# Patient Record
Sex: Female | Born: 1990 | Hispanic: Yes | Marital: Single | Smoking: Never smoker
Health system: Southern US, Community
[De-identification: ages and names within clinical notes are randomized; demographics above are authoritative.]

## PROBLEM LIST (undated history)

## (undated) DIAGNOSIS — R519 Headache, unspecified: Secondary | ICD-10-CM

## (undated) DIAGNOSIS — R51 Headache: Secondary | ICD-10-CM

## (undated) DIAGNOSIS — F411 Generalized anxiety disorder: Secondary | ICD-10-CM

## (undated) DIAGNOSIS — R030 Elevated blood-pressure reading, without diagnosis of hypertension: Secondary | ICD-10-CM

## (undated) DIAGNOSIS — M1990 Unspecified osteoarthritis, unspecified site: Secondary | ICD-10-CM

## (undated) DIAGNOSIS — F32A Depression, unspecified: Secondary | ICD-10-CM

## (undated) DIAGNOSIS — F329 Major depressive disorder, single episode, unspecified: Secondary | ICD-10-CM

## (undated) HISTORY — DX: Major depressive disorder, single episode, unspecified: F32.9

## (undated) HISTORY — DX: Depression, unspecified: F32.A

## (undated) HISTORY — DX: Headache, unspecified: R51.9

## (undated) HISTORY — DX: Elevated blood-pressure reading, without diagnosis of hypertension: R03.0

## (undated) HISTORY — DX: Unspecified osteoarthritis, unspecified site: M19.90

## (undated) HISTORY — DX: Generalized anxiety disorder: F41.1

## (undated) HISTORY — DX: Headache: R51

---

## 2016-07-26 HISTORY — PX: MULTIPLE TOOTH EXTRACTIONS: SHX2053

## 2016-12-22 ENCOUNTER — Ambulatory Visit (INDEPENDENT_AMBULATORY_CARE_PROVIDER_SITE_OTHER): Payer: 59 | Admitting: Primary Care

## 2016-12-22 ENCOUNTER — Encounter: Payer: Self-pay | Admitting: Primary Care

## 2016-12-22 ENCOUNTER — Encounter (INDEPENDENT_AMBULATORY_CARE_PROVIDER_SITE_OTHER): Payer: Self-pay

## 2016-12-22 VITALS — BP 126/80 | HR 114 | Temp 98.5°F | Ht 61.25 in | Wt 158.0 lb

## 2016-12-22 DIAGNOSIS — F419 Anxiety disorder, unspecified: Secondary | ICD-10-CM | POA: Diagnosis not present

## 2016-12-22 DIAGNOSIS — F329 Major depressive disorder, single episode, unspecified: Secondary | ICD-10-CM

## 2016-12-22 DIAGNOSIS — F32A Depression, unspecified: Secondary | ICD-10-CM

## 2016-12-22 MED ORDER — SERTRALINE HCL 25 MG PO TABS: 25.0000 mg | ORAL_TABLET | Freq: Every day | ORAL | 1 refills | Status: DC

## 2016-12-22 NOTE — Progress Notes (Signed)
   Subjective:    Patient ID: Gail Copeland, female    DOB: 1991-01-08, 26 y.o.   MRN: [id number]  HPI  Gail Copeland is a 25 year old female who presents today to establish care and discuss the problems mentioned below. Will obtain old records.  1) Elevated Blood Pressure Reading: Reading of 144/82 in the office today, 126/80 on recheck. She has a history of elevated readings in the doctor's office. She's checked her BP several times at [facility] with readings of 120/80's. She denies chest pain, shortness of breath, dizziness. She is under a lot of stress with work and school.   2) Depression: She will experience tearfulness, feeling sad/down, fatigue, anxiety in large public spaces, difficulty sleeping. Symptoms have been present intermittently for most of her adult life. PHQ 9 score of 17 and GAD 7 score of 18 today. She is working two jobs and is in school full time. Denies SI/HI. She is interested in treatment.   Review of Systems  Constitutional: Positive for fatigue.  Respiratory: Negative for shortness of breath.   Cardiovascular: Negative for chest pain.  Neurological: Negative for dizziness.  Psychiatric/Behavioral:       See HPI       Past Medical History:  Diagnosis Date  . Arthritis   . Depression   . Elevated BP without diagnosis of hypertension   . Frequent headaches   . GAD (generalized anxiety disorder)      Social History   Social History  . Marital status: Single    Spouse name: N/A  . Number of children: N/A  . Years of education: N/A   Occupational History  . Not on file.   Social History Main Topics  . Smoking status: Never Smoker  . Smokeless tobacco: Never Used  . Alcohol use No  . Drug use: Unknown  . Sexual activity: Not on file   Other Topics Concern  . Not on file   Social History Narrative   Single.   No children.   Works as a Nutritional therapist.   Currently a Consulting civil engineer at [employer].   Enjoys spending time outdoors.     Past  Surgical History:  Procedure Laterality Date  . MULTIPLE TOOTH EXTRACTIONS  07/2016    Family History  Problem Relation Age of Onset  . Arthritis Mother   . Diabetes Paternal Grandfather   . Heart attack Paternal Grandfather   . Hyperlipidemia Paternal Grandfather   . Hypertension Paternal Grandfather   . Kidney disease Paternal Grandfather     No Known Allergies  No current outpatient prescriptions on file prior to visit.   No current facility-administered medications on file prior to visit.     BP 126/80   Pulse (!) 114   Temp 98.5 F (36.9 C) (Oral)   Ht 5' 1.25" (1.556 m)   Wt 158 lb (71.7 kg)   LMP 12/13/2016   SpO2 99%   BMI 29.61 kg/m    Objective:   Physical Exam  Constitutional: She appears well-nourished.  Neck: Neck supple.  Cardiovascular: Normal rate and regular rhythm.   Pulmonary/Chest: Effort normal and breath sounds normal.  Skin: Skin is warm and dry.  Psychiatric: She has a normal mood and affect.          Assessment & Plan:

## 2016-12-22 NOTE — Patient Instructions (Addendum)
Start taking sertraline (Zoloft) 25 mg tablets for anxiety and depression. Start by taking 1/2 tablet daily for 8 days then increase to 1 full tablet thereafter.  Please call or message me if you have any problems.   Please schedule a follow up visit with me in 6 weeks for re-evaluation.   It was a pleasure to meet you today! Please don't hesitate to call me with any questions. Welcome to [facility]!

## 2016-12-22 NOTE — Assessment & Plan Note (Signed)
GAD 7 score of 18 and PHQ 9 score of 17 today.  No prior treatment. Discussed options for treatment including CBT and medication, she opts for medication.  Rx for Zoloft 25 mg sent to pharmacy. Patient is to take 1/2 tablet daily for 8 days, then advance to 1 full tablet thereafter. We discussed possible side effects of headache, GI upset, drowsiness, and SI/HI. If thoughts of SI/HI develop, we discussed to present to the emergency immediately. Patient verbalized understanding.   Follow up in 6 weeks for re-evaluation.

## 2017-02-02 ENCOUNTER — Ambulatory Visit: Payer: 59 | Admitting: Primary Care

## 2017-02-03 ENCOUNTER — Encounter: Payer: Self-pay | Admitting: Primary Care

## 2017-02-03 ENCOUNTER — Ambulatory Visit (INDEPENDENT_AMBULATORY_CARE_PROVIDER_SITE_OTHER): Payer: 59 | Admitting: Primary Care

## 2017-02-03 VITALS — BP 108/62 | HR 74 | Temp 98.2°F | Wt 153.5 lb

## 2017-02-03 DIAGNOSIS — F419 Anxiety disorder, unspecified: Secondary | ICD-10-CM

## 2017-02-03 DIAGNOSIS — F329 Major depressive disorder, single episode, unspecified: Secondary | ICD-10-CM | POA: Diagnosis not present

## 2017-02-03 DIAGNOSIS — F32A Depression, unspecified: Secondary | ICD-10-CM

## 2017-02-03 MED ORDER — SERTRALINE HCL 50 MG PO TABS: 50.0000 mg | ORAL_TABLET | Freq: Every day | ORAL | 1 refills | Status: DC

## 2017-02-03 NOTE — Progress Notes (Signed)
   Subjective:    Patient ID: Gail Copeland, female    DOB: 13-Sep-1990, 26 y.o.   MRN: [id number]  HPI  Gail Copeland is a 26 year old female who presents today for follow up of anxiety and depression.   She presented as a new patient in late August 2018 with complaints of tearfulness, feeling sad/down, fatigue, anxiety in public spaces, insomnia. PHQ 9 score of 17 and GAD 7 score of 18 during that visit so she was initiated on Zoloft 25 mg as she declined therapy.   Since her last visit she's noticed some improvement. She's noticed that she's not crying as much. Since her last visit her mother was diagnosed with breast cancer so this has caused a lot of new stress. She continues to have insomnia as she will have no problem falling asleep but will wake several times during the middle of the night most every day of the week. She's tried Melatonin in the past without improvement.   She denies GI upset, nausea, SI/HI. Overall she feels as though we are on the right path with Zoloft.   Review of Systems  Respiratory: Negative for shortness of breath.   Cardiovascular: Negative for chest pain.  Gastrointestinal: Negative for abdominal pain and nausea.  Psychiatric/Behavioral: Positive for sleep disturbance. Negative for suicidal ideas. The patient is nervous/anxious.        See HPI       Past Medical History:  Diagnosis Date  . Arthritis   . Depression   . Elevated BP without diagnosis of hypertension   . Frequent headaches   . GAD (generalized anxiety disorder)      Social History   Social History  . Marital status: Single    Spouse name: N/A  . Number of children: N/A  . Years of education: N/A   Occupational History  . Not on file.   Social History Main Topics  . Smoking status: Never Smoker  . Smokeless tobacco: Never Used  . Alcohol use No  . Drug use: Unknown  . Sexual activity: Not on file   Other Topics Concern  . Not on file   Social History Narrative   Single.   No children.   Works as a Nutritional therapist.   Currently a Consulting civil engineer at [employer].   Enjoys spending time outdoors.     Past Surgical History:  Procedure Laterality Date  . MULTIPLE TOOTH EXTRACTIONS  07/2016    Family History  Problem Relation Age of Onset  . Arthritis Mother   . Diabetes Paternal Grandfather   . Heart attack Paternal Grandfather   . Hyperlipidemia Paternal Grandfather   . Hypertension Paternal Grandfather   . Kidney disease Paternal Grandfather     No Known Allergies  No current outpatient prescriptions on file prior to visit.   No current facility-administered medications on file prior to visit.     BP 108/62   Pulse 74   Temp 98.2 F (36.8 C) (Oral)   Wt 153 lb 8 oz (69.6 kg)   LMP 01/14/2017   SpO2 99%   BMI 28.77 kg/m    Objective:   Physical Exam  Constitutional: She appears well-nourished.  Neck: Neck supple.  Cardiovascular: Normal rate and regular rhythm.   Pulmonary/Chest: Effort normal and breath sounds normal.  Skin: Skin is warm and dry.  Psychiatric: She has a normal mood and affect.          Assessment & Plan:

## 2017-02-03 NOTE — Assessment & Plan Note (Signed)
Slight improvement on Zoloft 25 mg, will likely benefit from an increased dose. Discussed this with patient and she agrees. Rx for Zoloft 50 mg sent to pharmacy, follow up in 6 weeks.

## 2017-02-03 NOTE — Patient Instructions (Signed)
We've increased the dose of your Zoloft form 25 mg to 50 mg.  You may take two of the 25 mg tablets to equal 50 mg until your current bottle is empty.  Schedule a follow up visit in 6 weeks for re-evaluation.  It was a pleasure to see you today!

## 2017-02-20 ENCOUNTER — Other Ambulatory Visit: Payer: Self-pay | Admitting: Primary Care

## 2017-02-20 DIAGNOSIS — F32A Depression, unspecified: Secondary | ICD-10-CM

## 2017-02-20 DIAGNOSIS — F329 Major depressive disorder, single episode, unspecified: Secondary | ICD-10-CM

## 2017-02-20 DIAGNOSIS — F419 Anxiety disorder, unspecified: Principal | ICD-10-CM

## 2017-03-17 ENCOUNTER — Ambulatory Visit: Payer: 59 | Admitting: Primary Care

## 2017-03-26 ENCOUNTER — Ambulatory Visit (INDEPENDENT_AMBULATORY_CARE_PROVIDER_SITE_OTHER): Payer: 59 | Admitting: Primary Care

## 2017-03-26 ENCOUNTER — Encounter: Payer: Self-pay | Admitting: Primary Care

## 2017-03-26 DIAGNOSIS — F419 Anxiety disorder, unspecified: Secondary | ICD-10-CM | POA: Diagnosis not present

## 2017-03-26 DIAGNOSIS — F329 Major depressive disorder, single episode, unspecified: Secondary | ICD-10-CM

## 2017-03-26 DIAGNOSIS — F32A Depression, unspecified: Secondary | ICD-10-CM

## 2017-03-26 NOTE — Patient Instructions (Signed)
Please notify me if your symptoms progress and/or if you'd like to consider therapy.  It was a pleasure to see you today!

## 2017-03-26 NOTE — Progress Notes (Signed)
Subjective:    Patient ID: Gail RoutNancy Copeland, female    DOB: [birth year], 26 y.o.   MRN: [id number]  HPI  Gail Copeland is a 26 year old female who presents today for follow up of anxiety and depression.   She was last evaluated on 02/03/17 for anxiety and depression. She had been on Zoloft 25 mg and endorsed noticing a light improvement, but not significant. Her dose was increased to 50 mg that visit.  Since her last visit she's stopped taking her medication. She's not had this in over one week. She missed a few days of her medication and didn't feel any different. She has co-workers that are taking Effexor and will notice when they don't take their medication, she doesn't want to be like this. She did experience GI upset with the increased dose of Zoloft.   She would like to remain off of medication for anxiety and depression. She's still not sleeping well, failed Melatonin and Unisom OTC. She's currently taking Zzzquil which is effective. She does not wish to discuss other treatment for insomnia.    Review of Systems  Psychiatric/Behavioral: Positive for sleep disturbance.       See HPI       Past Medical History:  Diagnosis Date  . Arthritis   . Depression   . Elevated BP without diagnosis of hypertension   . Frequent headaches   . GAD (generalized anxiety disorder)      Social History   Socioeconomic History  . Marital status: Single    Spouse name: Not on file  . Number of children: Not on file  . Years of education: Not on file  . Highest education level: Not on file  Social Needs  . Financial resource strain: Not on file  . Food insecurity - worry: Not on file  . Food insecurity - inability: Not on file  . Transportation needs - medical: Not on file  . Transportation needs - non-medical: Not on file  Occupational History  . Not on file  Tobacco Use  . Smoking status: Never Smoker  . Smokeless tobacco: Never Used  Substance and Sexual Activity  . Alcohol use: No  .  Drug use: Not on file  . Sexual activity: Not on file  Other Topics Concern  . Not on file  Social History Narrative   Single.   No children.   Works as a Nutritional therapistlab and pharmacy tech.   Currently a Consulting civil engineerstudent at [employer].   Enjoys spending time outdoors.       Family History  Problem Relation Age of Onset  . Arthritis Mother   . Diabetes Paternal Grandfather   . Heart attack Paternal Grandfather   . Hyperlipidemia Paternal Grandfather   . Hypertension Paternal Grandfather   . Kidney disease Paternal Grandfather     No Known Allergies  No current outpatient medications on file prior to visit.   No current facility-administered medications on file prior to visit.     BP 120/82   Pulse 81   Temp 98.2 F (36.8 C) (Oral)   Ht 5' 1.25" (1.556 m)   Wt 160 lb 12.8 oz (72.9 kg)   SpO2 99%   BMI 30.14 kg/m    Objective:   Physical Exam  Constitutional: She appears well-nourished.  Neck: Neck supple.  Cardiovascular: Normal rate and regular rhythm.  Pulmonary/Chest: Effort normal and breath sounds normal.  Skin: Skin is warm and dry.  Psychiatric: She has a normal mood and affect.  Assessment & Plan:

## 2017-03-26 NOTE — Assessment & Plan Note (Signed)
No Zoloft in one week, well continue to hold for now. Discussed other options for treatment including medications and therapy, she kindly declines. She will update if symptoms progress.

## 2017-04-17 ENCOUNTER — Other Ambulatory Visit: Payer: Self-pay | Admitting: Primary Care

## 2017-04-17 DIAGNOSIS — F329 Major depressive disorder, single episode, unspecified: Secondary | ICD-10-CM

## 2017-04-17 DIAGNOSIS — F419 Anxiety disorder, unspecified: Principal | ICD-10-CM

## 2017-04-17 DIAGNOSIS — F32A Depression, unspecified: Secondary | ICD-10-CM

## 2017-06-21 ENCOUNTER — Encounter: Payer: Self-pay | Admitting: Primary Care

## 2017-06-21 ENCOUNTER — Ambulatory Visit: Payer: 59 | Admitting: Primary Care

## 2017-06-21 VITALS — BP 118/84 | HR 90 | Temp 98.5°F | Ht 61.25 in | Wt 169.5 lb

## 2017-06-21 DIAGNOSIS — R51 Headache: Secondary | ICD-10-CM

## 2017-06-21 DIAGNOSIS — R519 Headache, unspecified: Secondary | ICD-10-CM

## 2017-06-21 LAB — BASIC METABOLIC PANEL
BUN: 12 mg/dL (ref 6–23)
CALCIUM: 9.8 mg/dL (ref 8.4–10.5)
CO2: 29 mEq/L (ref 19–32)
Chloride: 102 mEq/L (ref 96–112)
Creatinine, Ser: 0.5 mg/dL (ref 0.40–1.20)
GFR: 159.22 mL/min (ref >60.00)
Glucose, Bld: 90 mg/dL (ref 70–99)
Potassium: 3.8 mEq/L (ref 3.5–5.1)
SODIUM: 138 meq/L (ref 135–145)

## 2017-06-21 LAB — CBC
HEMATOCRIT: 38.3 % (ref 36.0–46.0)
Hemoglobin: 12.7 g/dL (ref 12.0–15.0)
MCHC: 33.1 g/dL (ref 30.0–36.0)
MCV: 83 fl (ref 78.0–100.0)
Platelets: 324 10*3/uL (ref 150.0–400.0)
RBC: 4.61 Mil/uL (ref 3.87–5.11)
RDW: 14.9 % (ref 11.5–15.5)
WBC: 7.6 10*3/uL (ref 4.0–10.5)

## 2017-06-21 MED ORDER — KETOROLAC TROMETHAMINE 60 MG/2ML IM SOLN
60.0000 mg | Freq: Once | INTRAMUSCULAR | Status: AC
  Administered 2017-06-21: 60 mg via INTRAMUSCULAR

## 2017-06-21 NOTE — Addendum Note (Signed)
Addended by: Tawnya CrookSAMBATH, Mychael Smock on: 06/21/2017 01:04 PM   Modules accepted: Orders

## 2017-06-21 NOTE — Patient Instructions (Signed)
Stop by the lab prior to leaving today. I will notify you of your results once received.   You were provided with an injection for Toradol for the headache.   You can also try taking naproxen (Aleve) or Excedrin Migraine as needed for headache symptoms.   You can also try Meclizine tablets for dizziness. Caution as this may cause drowsiness.   Please notify me if your symptoms persist despite treatment.   It was a pleasure to see you today!

## 2017-06-21 NOTE — Progress Notes (Signed)
Subjective:    Patient ID: Gail Copeland, female    DOB: 1990-09-07, 27 y.o.   MRN: [id number]  HPI  Gail Copeland is a 27 year old female who presents today with a chief complaint of headache. She also reports dizziness, nausea, neck pain, rhinorrhea. Her symptoms began two weeks ago.   Her headache is located to the bilateral occipital lobes which she first noticed 2 weeks ago. She'll also experience pain to the left parietal region when she feels dizzy. She'll experience dizziness with most any movement. Yesterday she noticed blurred vision to her right eye when looking at her phone, this lasted for about one hour and has not returned. Her last eye exam was in November 2018 and was normal.   She denies photophobia, phonophobia, vomiting, abdominal pain, diarrhea, fevers, cough, weakness. She's ben taking Tylenol and Nyquil at bedtime every night for the past one week with improvement. Her rhinorrhea has dissipated.   Review of Systems  Constitutional: Negative for chills and fever.  HENT: Negative for congestion.   Respiratory: Negative for cough and shortness of breath.   Neurological: Positive for dizziness and headaches. Negative for weakness.       Past Medical History:  Diagnosis Date  . Arthritis   . Depression   . Elevated BP without diagnosis of hypertension   . Frequent headaches   . GAD (generalized anxiety disorder)      Social History   Socioeconomic History  . Marital status: Single    Spouse name: Not on file  . Number of children: Not on file  . Years of education: Not on file  . Highest education level: Not on file  Social Needs  . Financial resource strain: Not on file  . Food insecurity - worry: Not on file  . Food insecurity - inability: Not on file  . Transportation needs - medical: Not on file  . Transportation needs - non-medical: Not on file  Occupational History  . Not on file  Tobacco Use  . Smoking status: Never Smoker  . Smokeless tobacco:  Never Used  Substance and Sexual Activity  . Alcohol use: No  . Drug use: Not on file  . Sexual activity: Not on file  Other Topics Concern  . Not on file  Social History Narrative   Single.   No children.   Works as a Nutritional therapist.   Currently a Consulting civil engineer at [employer].   Enjoys spending time outdoors.      Family History  Problem Relation Age of Onset  . Arthritis Mother   . Diabetes Paternal Grandfather   . Heart attack Paternal Grandfather   . Hyperlipidemia Paternal Grandfather   . Hypertension Paternal Grandfather   . Kidney disease Paternal Grandfather     No Known Allergies  No current outpatient medications on file prior to visit.   No current facility-administered medications on file prior to visit.     BP 118/84   Pulse 90   Temp 98.5 F (36.9 C) (Oral)   Ht 5' 1.25" (1.556 m)   Wt 169 lb 8 oz (76.9 kg)   LMP 06/08/2017   SpO2 98%   BMI 31.77 kg/m    Objective:   Physical Exam  Constitutional: She is oriented to person, place, and time. She appears well-nourished.  HENT:  Right Ear: Tympanic membrane and ear canal normal.  Left Ear: Tympanic membrane and ear canal normal.  Nose: Right sinus exhibits no maxillary sinus tenderness  and no frontal sinus tenderness. Left sinus exhibits no maxillary sinus tenderness and no frontal sinus tenderness.  Mouth/Throat: Oropharynx is clear and moist.  Eyes: Conjunctivae and EOM are normal.  Neck: Neck supple.  Cardiovascular: Normal rate and regular rhythm.  Pulmonary/Chest: Effort normal and breath sounds normal. She has no wheezes. She has no rales.  Lymphadenopathy:    She has no cervical adenopathy.  Neurological: She is alert and oriented to person, place, and time. No cranial nerve deficit.  Negative Brudzinski and Kernig's signs.  Skin: Skin is warm and dry.          Assessment & Plan:  Occipital Headache:  Present for the past 2 weeks, also with dizziness, neck pain. Exam  today unremarkable, low suspicion for meningitis (no fevers, negative testing in the office today). Check CBC. Symptoms likely from headache/migraine. Treat with IM Toradol today. Use Aleve/Excedrin Migraine PRN. Also discussed use of Meclizine with drowsiness precautions provided.  She'll update if her headache persists after treatment.    Doreene NestKatherine K Clark, NP

## 2017-06-22 ENCOUNTER — Other Ambulatory Visit: Payer: Self-pay | Admitting: Primary Care

## 2017-06-22 ENCOUNTER — Ambulatory Visit: Payer: Self-pay

## 2017-06-22 DIAGNOSIS — R519 Headache, unspecified: Secondary | ICD-10-CM

## 2017-06-22 DIAGNOSIS — R51 Headache: Principal | ICD-10-CM

## 2017-06-22 MED ORDER — SUMATRIPTAN SUCCINATE 25 MG PO TABS: ORAL_TABLET | ORAL | 0 refills | Status: DC

## 2017-06-22 NOTE — Telephone Encounter (Signed)
Spoke with patient who reports temporary improvement with Toradol so she returned to work. Within 1 hour after returning to work she experienced phonophobia and return in headaches. She vomited 2-3 times and felt better. She was sent home from work today due to headaches. Will send in treatment for migraine, Imitrex. Discussed instructions on use. Also discussed to go to the hospital for further evaluation if no improvement after treatment as she may need CT scan and IV treatment. She verbalized understanding.

## 2017-06-22 NOTE — Telephone Encounter (Signed)
Patient called in with c/o "headache with vomiting." She says "I was in the office yesterday, received a shot of Toradol and the headache was better. It came back as the night went on and I started vomiting. The pain is 6, then after I vomit it's a 3-4. I tried Aleve, but vomited 30 minutes after taking it, so I don't think it helped." I advised this would be sent over to the provider and someone will be in touch with the recommendations, she verbalized understanding.

## 2017-06-23 ENCOUNTER — Ambulatory Visit: Payer: 59 | Admitting: Primary Care

## 2017-06-29 ENCOUNTER — Encounter: Payer: Self-pay | Admitting: Primary Care

## 2017-06-29 DIAGNOSIS — G4452 New daily persistent headache (NDPH): Secondary | ICD-10-CM

## 2017-07-13 ENCOUNTER — Ambulatory Visit: Admission: RE | Admit: 2017-07-13 | Payer: 59 | Source: Ambulatory Visit

## 2017-07-22 ENCOUNTER — Ambulatory Visit
Admission: RE | Admit: 2017-07-22 | Discharge: 2017-07-22 | Disposition: A | Discharge status: Home / Self Care | Payer: 59 | Source: Ambulatory Visit | Attending: Primary Care | Admitting: Primary Care

## 2017-07-22 DIAGNOSIS — G4452 New daily persistent headache (NDPH): Secondary | ICD-10-CM | POA: Diagnosis not present

## 2017-07-22 MED ORDER — IOPAMIDOL (ISOVUE-370) INJECTION 76%
75.0000 mL | Freq: Once | INTRAVENOUS | Status: AC | PRN
  Administered 2017-07-22: 10:00:00 via INTRAVENOUS

## 2017-09-21 ENCOUNTER — Ambulatory Visit: Payer: 59 | Admitting: Primary Care

## 2017-09-21 VITALS — BP 116/70 | HR 60 | Temp 98.5°F | Ht 61.25 in | Wt 173.0 lb

## 2017-09-21 DIAGNOSIS — N926 Irregular menstruation, unspecified: Secondary | ICD-10-CM | POA: Diagnosis not present

## 2017-09-21 LAB — BASIC METABOLIC PANEL
BUN: 10 mg/dL (ref 6–23)
CALCIUM: 9.6 mg/dL (ref 8.4–10.5)
CO2: 27 mEq/L (ref 19–32)
Chloride: 105 mEq/L (ref 96–112)
Creatinine, Ser: 0.57 mg/dL (ref 0.40–1.20)
GFR: 136.61 mL/min (ref >60.00)
GLUCOSE: 87 mg/dL (ref 70–99)
POTASSIUM: 3.9 meq/L (ref 3.5–5.1)
SODIUM: 140 meq/L (ref 135–145)

## 2017-09-21 LAB — HEMOGLOBIN A1C: HEMOGLOBIN A1C: 5.4 % (ref 4.6–6.5)

## 2017-09-21 LAB — TSH: TSH: 1.59 u[IU]/mL (ref 0.35–4.50)

## 2017-09-21 NOTE — Patient Instructions (Signed)
Stop by the lab prior to leaving today. I will notify you of your results once received.   Please come see me if you have no period between now and August.   It was a pleasure to see you today!

## 2017-09-21 NOTE — Assessment & Plan Note (Signed)
Amenorrhea during months of March and April 2019. LMP 09/15/17, normal cycle. Check TSH, BMP, A1C today to rule out metabolic cause of irregularity.  Will monitor cycles for now and have her follow up in late August if no menstrual cycle. Consider birth control vs GYN evaluation if amenorrhea persists.

## 2017-09-21 NOTE — Progress Notes (Signed)
Subjective:    Patient ID: Gail Copeland, female    DOB: 04-Aug-1990, 27 y.o.   MRN: [id number]  HPI  Gail Copeland is a 27 year old female who presents today with a chief complaint of irregular menstrual cycle. Menarche at age 34.   She's had monthly menstrual cycles since menarche up until February 2019. She did not have a menstrual cycle in March and April 2019. Her menstrual cycle began on May 22nd and ended May 27th. This was a normal cycle.   She denies changes in diet, extreme exercise, increase in stress levels. She denies menorrhagia and dysmenorrhea. Her headaches have significantly improved since she's finished with school for the semester.   Review of Systems  Constitutional: Negative for fatigue.  Cardiovascular: Negative for palpitations.  Genitourinary: Positive for menstrual problem. Negative for dysuria, frequency, pelvic pain and vaginal discharge.  Psychiatric/Behavioral: The patient is not nervous/anxious.        Past Medical History:  Diagnosis Date  . Arthritis   . Depression   . Elevated BP without diagnosis of hypertension   . Frequent headaches   . GAD (generalized anxiety disorder)      Social History   Socioeconomic History  . Marital status: Single    Spouse name: Not on file  . Number of children: Not on file  . Years of education: Not on file  . Highest education level: Not on file  Occupational History  . Not on file  Social Needs  . Financial resource strain: Not on file  . Food insecurity:    Worry: Not on file    Inability: Not on file  . Transportation needs:    Medical: Not on file    Non-medical: Not on file  Tobacco Use  . Smoking status: Never Smoker  . Smokeless tobacco: Never Used  Substance and Sexual Activity  . Alcohol use: No  . Drug use: Not on file  . Sexual activity: Not on file  Lifestyle  . Physical activity:    Days per week: Not on file    Minutes per session: Not on file  . Stress: Not on file  Relationships    . Social connections:    Talks on phone: Not on file    Gets together: Not on file    Attends religious service: Not on file    Active member of club or organization: Not on file    Attends meetings of clubs or organizations: Not on file    Relationship status: Not on file  . Intimate partner violence:    Fear of current or ex partner: Not on file    Emotionally abused: Not on file    Physically abused: Not on file    Forced sexual activity: Not on file  Other Topics Concern  . Not on file  Social History Narrative   Single.   No children.   Works as a Nutritional therapist.   Currently a Consulting civil engineer at [employer].   Enjoys spending time outdoors.      Family History  Problem Relation Age of Onset  . Arthritis Mother   . Diabetes Paternal Grandfather   . Heart attack Paternal Grandfather   . Hyperlipidemia Paternal Grandfather   . Hypertension Paternal Grandfather   . Kidney disease Paternal Grandfather     No Known Allergies  Current Outpatient Medications on File Prior to Visit  Medication Sig Dispense Refill  . SUMAtriptan (IMITREX) 25 MG tablet Take 1 tablet  by mouth at migraine onset. May repeat in 2 hours if headache persists or recurs. Do not exceed 2 tablets in 24 hours. (Patient not taking: Reported on 09/21/2017) 10 tablet 0   No current facility-administered medications on file prior to visit.     BP 116/70   Pulse 60   Temp 98.5 F (36.9 C) (Oral)   Ht 5' 1.25" (1.556 m)   Wt 173 lb (78.5 kg)   SpO2 98%   BMI 32.42 kg/m    Objective:   Physical Exam  Constitutional: She appears well-nourished.  Neck: Neck supple.  Cardiovascular: Normal rate and regular rhythm.  Respiratory: Effort normal and breath sounds normal.  Skin: Skin is warm and dry.           Assessment & Plan:

## 2017-10-13 ENCOUNTER — Other Ambulatory Visit: Payer: Self-pay | Admitting: Primary Care

## 2017-10-13 DIAGNOSIS — Z0000 Encounter for general adult medical examination without abnormal findings: Secondary | ICD-10-CM

## 2017-11-04 ENCOUNTER — Other Ambulatory Visit (INDEPENDENT_AMBULATORY_CARE_PROVIDER_SITE_OTHER): Payer: 59

## 2017-11-04 DIAGNOSIS — Z0000 Encounter for general adult medical examination without abnormal findings: Secondary | ICD-10-CM | POA: Diagnosis not present

## 2017-11-04 LAB — LIPID PANEL
CHOL/HDL RATIO: 3
CHOLESTEROL: 166 mg/dL (ref 0–200)
HDL: 60 mg/dL (ref >39.00)
LDL Cholesterol: 94 mg/dL (ref 0–99)
NonHDL: 105.72
TRIGLYCERIDES: 58 mg/dL (ref 0.0–149.0)
VLDL: 11.6 mg/dL (ref 0.0–40.0)

## 2017-11-04 LAB — HEPATIC FUNCTION PANEL
ALT: 14 U/L (ref 0–35)
AST: 19 U/L (ref 0–37)
Albumin: 4.4 g/dL (ref 3.5–5.2)
Alkaline Phosphatase: 68 U/L (ref 39–117)
Bilirubin, Direct: 0 mg/dL (ref 0.0–0.3)
TOTAL PROTEIN: 7.9 g/dL (ref 6.0–8.3)
Total Bilirubin: 0.5 mg/dL (ref 0.2–1.2)

## 2017-11-09 ENCOUNTER — Encounter: Payer: Self-pay | Admitting: Primary Care

## 2017-11-09 ENCOUNTER — Ambulatory Visit (INDEPENDENT_AMBULATORY_CARE_PROVIDER_SITE_OTHER): Payer: 59 | Admitting: Primary Care

## 2017-11-09 VITALS — BP 116/70 | HR 65 | Temp 98.0°F | Ht 61.25 in | Wt 171.2 lb

## 2017-11-09 DIAGNOSIS — R519 Headache, unspecified: Secondary | ICD-10-CM | POA: Insufficient documentation

## 2017-11-09 DIAGNOSIS — R51 Headache: Secondary | ICD-10-CM

## 2017-11-09 DIAGNOSIS — F329 Major depressive disorder, single episode, unspecified: Secondary | ICD-10-CM | POA: Diagnosis not present

## 2017-11-09 DIAGNOSIS — F419 Anxiety disorder, unspecified: Secondary | ICD-10-CM | POA: Diagnosis not present

## 2017-11-09 DIAGNOSIS — N926 Irregular menstruation, unspecified: Secondary | ICD-10-CM

## 2017-11-09 DIAGNOSIS — Z0000 Encounter for general adult medical examination without abnormal findings: Secondary | ICD-10-CM | POA: Diagnosis not present

## 2017-11-09 DIAGNOSIS — Z23 Encounter for immunization: Secondary | ICD-10-CM

## 2017-11-09 DIAGNOSIS — F32A Depression, unspecified: Secondary | ICD-10-CM

## 2017-11-09 NOTE — Assessment & Plan Note (Signed)
Overall improved since reduced stress at work. Continue to monitor.

## 2017-11-09 NOTE — Addendum Note (Signed)
Addended by: Tawnya CrookSAMBATH, Miqueas Whilden on: 11/09/2017 05:03 PM   Modules accepted: Orders

## 2017-11-09 NOTE — Patient Instructions (Signed)
Start exercising. You should be getting 150 minutes of moderate intensity exercise weekly.  It's important to improve your diet by reducing consumption of fast food, fried food, processed snack foods, sugary drinks. Increase consumption of fresh vegetables and fruits, whole grains, water.  Ensure you are drinking 64 ounces of water daily.  You are due for a pap smear, please notify me when you are ready.  Please monitor your menstrual cycles as discussed. Work on [employer] and a healthy diet this may help regulate cycles.  It was a pleasure to see you today!   Preventive Care 18-39 Years, Female Preventive care refers to lifestyle choices and visits with your health care provider that can promote health and wellness. What does preventive care include?  A yearly physical exam. This is also called an annual well check.  Dental exams once or twice a year.  Routine eye exams. Ask your health care provider how often you should have your eyes checked.  Personal lifestyle choices, including: ? Daily care of your teeth and gums. ? Regular physical activity. ? Eating a healthy diet. ? Avoiding tobacco and drug use. ? Limiting alcohol use. ? Practicing safe sex. ? Taking vitamin and mineral supplements as recommended by your health care provider. What happens during an annual well check? The services and screenings done by your health care provider during your annual well check will depend on your age, overall health, lifestyle risk factors, and family history of disease. Counseling Your health care provider may ask you questions about your:  Alcohol use.  Tobacco use.  Drug use.  Emotional well-being.  Home and relationship well-being.  Sexual activity.  Eating habits.  Work and work Statistician.  Method of birth control.  Menstrual cycle.  Pregnancy history.  Screening You may have the following tests or measurements:  Height, weight, and BMI.  Diabetes screening.  This is done by checking your blood sugar (glucose) after you have not eaten for a while (fasting).  Blood pressure.  Lipid and cholesterol levels. These may be checked every 5 years starting at age 41.  Skin check.  Hepatitis C blood test.  Hepatitis B blood test.  Sexually transmitted disease (STD) testing.  BRCA-related cancer screening. This may be done if you have a family history of breast, ovarian, tubal, or peritoneal cancers.  Pelvic exam and Pap test. This may be done every 3 years starting at age 66. Starting at age 60, this may be done every 5 years if you have a Pap test in combination with an HPV test.  Discuss your test results, treatment options, and if necessary, the need for more tests with your health care provider. Vaccines Your health care provider may recommend certain vaccines, such as:  Influenza vaccine. This is recommended every year.  Tetanus, diphtheria, and acellular pertussis (Tdap, Td) vaccine. You may need a Td booster every 10 years.  Varicella vaccine. You may need this if you have not been vaccinated.  HPV vaccine. If you are 36 or younger, you may need three doses over 6 months.  Measles, mumps, and rubella (MMR) vaccine. You may need at least one dose of MMR. You may also need a second dose.  Pneumococcal 13-valent conjugate (PCV13) vaccine. You may need this if you have certain conditions and were not previously vaccinated.  Pneumococcal polysaccharide (PPSV23) vaccine. You may need one or two doses if you smoke cigarettes or if you have certain conditions.  Meningococcal vaccine. One dose is recommended if you are  age 44-21 years and a first-year college student living in a residence hall, or if you have one of several medical conditions. You may also need additional booster doses.  Hepatitis A vaccine. You may need this if you have certain conditions or if you travel or work in places where you may be exposed to hepatitis A.  Hepatitis B  vaccine. You may need this if you have certain conditions or if you travel or work in places where you may be exposed to hepatitis B.  Haemophilus influenzae type b (Hib) vaccine. You may need this if you have certain risk factors.  Talk to your health care provider about which screenings and vaccines you need and how often you need them. This information is not intended to replace advice given to you by your health care provider. Make sure you discuss any questions you have with your health care provider. Document Released: 06/09/2001 Document Revised: 01/01/2016 Document Reviewed: 02/12/2015 Elsevier Interactive Patient Education  [employer].

## 2017-11-09 NOTE — Assessment & Plan Note (Signed)
Continues to experience social anxiety, especially any more than 7 people in one room. She avoids heavily crowded places, goes to the grocery store. She does want to cry in these situations.   Referral placed to [facility] for further treatment. She agrees with plan.

## 2017-11-09 NOTE — Assessment & Plan Note (Signed)
Td due, provided today. Pap smear due, she kindly declines and will defer to later this year. Discussed the importance of a healthy diet and regular exercise in order for weight loss, and to reduce the risk of any potential medical problems. Exam unremarkable. Labs reviewed. Follow up in 1 year for CPE.

## 2017-11-09 NOTE — Assessment & Plan Note (Signed)
LMP was May 22nd.  Also due for pap smear for which she declines. Offered GYN referral for further evaluation of symptoms and for pap smear, she kindly declines.  Discussed the need for a pap smear, she verbalized understanding and will have this done later this year.   She would like to monitor and wait for a menstrual cycle. Discussed to work on a healthy diet and regular exercise to facilitate menstrual cycles.   She will update if no menstrual cycle in 2 months.

## 2017-11-09 NOTE — Progress Notes (Signed)
Subjective:    Patient ID: Gail Copeland, female    DOB: Dec 20, 1990, 27 y.o.   MRN: [id number]  HPI  Gail Copeland is a 27 year old female who presents today for complete physical.  Overall doing well.  Does continue to experience social anxiety, especially in crowds larger than 7 people. When she is around a large amounts of people she will find a corner to escape and feel like crying. She avoids crowded public spaces during the day such as the grocery store.  Menstrual cycles are still irregular. Last menstrual cycle was May 22nd.   Headaches overall improved.   Immunizations: -Tetanus: Unsure, thinks it was 2008 -Influenza: Completed last season   Diet: She endorses a poor diet Breakfast: Skips Lunch: Fast food Dinner: Rice, chicken Snacks: None Desserts: 3 times weekly  Beverages: Little water, lots of sweet tea  Exercise: She is not exercising  Eye exam: Completes annually Dental exam: Completes annually Pap Smear: Never completed, declines   Review of Systems  Constitutional: Negative for unexpected weight change.  HENT: Negative for rhinorrhea.   Respiratory: Negative for cough and shortness of breath.   Cardiovascular: Negative for chest pain.  Gastrointestinal: Negative for constipation and diarrhea.  Genitourinary: Positive for menstrual problem. Negative for difficulty urinating.  Musculoskeletal: Negative for arthralgias and myalgias.  Skin: Negative for rash.  Allergic/Immunologic: Negative for environmental allergies.  Neurological: Negative for dizziness and numbness.       Intermittent headaches  Psychiatric/Behavioral:       See HPI       Past Medical History:  Diagnosis Date  . Arthritis   . Depression   . Elevated BP without diagnosis of hypertension   . Frequent headaches   . GAD (generalized anxiety disorder)      Social History   Socioeconomic History  . Marital status: Single    Spouse name: Not on file  . Number of children: Not on  file  . Years of education: Not on file  . Highest education level: Not on file  Occupational History  . Not on file  Social Needs  . Financial resource strain: Not on file  . Food insecurity:    Worry: Not on file    Inability: Not on file  . Transportation needs:    Medical: Not on file    Non-medical: Not on file  Tobacco Use  . Smoking status: Never Smoker  . Smokeless tobacco: Never Used  Substance and Sexual Activity  . Alcohol use: No  . Drug use: Not on file  . Sexual activity: Not on file  Lifestyle  . Physical activity:    Days per week: Not on file    Minutes per session: Not on file  . Stress: Not on file  Relationships  . Social connections:    Talks on phone: Not on file    Gets together: Not on file    Attends religious service: Not on file    Active member of club or organization: Not on file    Attends meetings of clubs or organizations: Not on file    Relationship status: Not on file  . Intimate partner violence:    Fear of current or ex partner: Not on file    Emotionally abused: Not on file    Physically abused: Not on file    Forced sexual activity: Not on file  Other Topics Concern  . Not on file  Social History Narrative   Single.  No children.   Works as a Nutritional therapistlab and pharmacy tech.   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   Enjoys spending time outdoors.       Family History  Problem Relation Age of Onset  . Arthritis Mother   . Diabetes Paternal Grandfather   . Heart attack Paternal Grandfather   . Hyperlipidemia Paternal Grandfather   . Hypertension Paternal Grandfather   . Kidney disease Paternal Grandfather     No Known Allergies  Current Outpatient Medications on File Prior to Visit  Medication Sig Dispense Refill  . SUMAtriptan (IMITREX) 25 MG tablet Take 1 tablet by mouth at migraine onset. May repeat in 2 hours if headache persists or recurs. Do not exceed 2 tablets in 24 hours. (Patient not taking: Reported on  09/21/2017) 10 tablet 0   No current facility-administered medications on file prior to visit.     BP 116/70   Pulse 65   Temp 98 F (36.7 C) (Oral)   Ht 5' 1.25" (1.556 m)   Wt 171 lb 4 oz (77.7 kg)   LMP 09/09/2017   SpO2 99%   BMI 32.09 kg/m    Objective:   Physical Exam  Constitutional: She is oriented to person, place, and time. She appears well-nourished.  HENT:  Mouth/Throat: No oropharyngeal exudate.  Eyes: Pupils are equal, round, and reactive to light. EOM are normal.  Neck: Neck supple. No thyromegaly present.  Cardiovascular: Normal rate and regular rhythm.  Respiratory: Effort normal and breath sounds normal.  GI: Soft. Bowel sounds are normal. There is no tenderness.  Musculoskeletal: Normal range of motion.  Neurological: She is alert and oriented to person, place, and time.  Skin: Skin is warm and dry.  Psychiatric: She has a normal mood and affect.           Assessment & Plan:

## 2017-11-25 ENCOUNTER — Ambulatory Visit: Payer: 59 | Admitting: Psychology

## 2017-11-25 DIAGNOSIS — F411 Generalized anxiety disorder: Secondary | ICD-10-CM

## 2018-02-28 ENCOUNTER — Ambulatory Visit (INDEPENDENT_AMBULATORY_CARE_PROVIDER_SITE_OTHER): Payer: 59 | Admitting: Internal Medicine

## 2018-02-28 ENCOUNTER — Encounter: Payer: Self-pay | Admitting: Internal Medicine

## 2018-02-28 VITALS — BP 114/80 | HR 77 | Temp 98.1°F | Wt 175.0 lb

## 2018-02-28 DIAGNOSIS — J302 Other seasonal allergic rhinitis: Secondary | ICD-10-CM

## 2018-02-28 DIAGNOSIS — R05 Cough: Secondary | ICD-10-CM

## 2018-02-28 DIAGNOSIS — R059 Cough, unspecified: Secondary | ICD-10-CM

## 2018-02-28 MED ORDER — BENZONATATE 200 MG PO CAPS: 200.0000 mg | ORAL_CAPSULE | Freq: Three times a day (TID) | ORAL | 0 refills | Status: DC | PRN

## 2018-02-28 MED ORDER — HYDROCODONE-HOMATROPINE 5-1.5 MG/5ML PO SYRP: 5.0000 mL | ORAL_SOLUTION | Freq: Three times a day (TID) | ORAL | 0 refills | Status: DC | PRN

## 2018-02-28 NOTE — Progress Notes (Signed)
HPI  Pt presents to the clinic today with c/o runny nose, ear fullness, sore throat and cough. She reports this started 2-3 weeks ago. She is blowing clear mucous out of her nose. She denies ear pain, drainage or decreased hearing. She has had some difficulty swallowing. The cough is productive of white mucous. She denies nasal congestion or shortness of breath. She denies fever, chills or body aches. She has tried Tylenol, Delsym, Nyquil and Flonase with minimal relief. She has no history of allergies. She has not had sick contacts.  Review of Systems      Past Medical History:  Diagnosis Date  . Arthritis   . Depression   . Elevated BP without diagnosis of hypertension   . Frequent headaches   . GAD (generalized anxiety disorder)     Family History  Problem Relation Age of Onset  . Arthritis Mother   . Diabetes Paternal Grandfather   . Heart attack Paternal Grandfather   . Hyperlipidemia Paternal Grandfather   . Hypertension Paternal Grandfather   . Kidney disease Paternal Grandfather     Social History   Socioeconomic History  . Marital status: Single    Spouse name: Not on file  . Number of children: Not on file  . Years of education: Not on file  . Highest education level: Not on file  Occupational History  . Not on file  Social Needs  . Financial resource strain: Not on file  . Food insecurity:    Worry: Not on file    Inability: Not on file  . Transportation needs:    Medical: Not on file    Non-medical: Not on file  Tobacco Use  . Smoking status: Never Smoker  . Smokeless tobacco: Never Used  Substance and Sexual Activity  . Alcohol use: No  . Drug use: Not on file  . Sexual activity: Not on file  Lifestyle  . Physical activity:    Days per week: Not on file    Minutes per session: Not on file  . Stress: Not on file  Relationships  . Social connections:    Talks on phone: Not on file    Gets together: Not on file    Attends religious service: Not  on file    Active member of club or organization: Not on file    Attends meetings of clubs or organizations: Not on file    Relationship status: Not on file  . Intimate partner violence:    Fear of current or ex partner: Not on file    Emotionally abused: Not on file    Physically abused: Not on file    Forced sexual activity: Not on file  Other Topics Concern  . Not on file  Social History Narrative   Single.   No children.   Works as a Nutritional therapist.   Currently a Consulting civil engineer at [employer].   Enjoys spending time outdoors.     No Known Allergies   Constitutional: Denies headache, fatigue, fever or abrupt weight changes.  HEENT:  Positive runny nose, ear fullness, sore throat. Denies eye redness, eye pain, pressure behind the eyes, facial pain, nasal congestion, ear pain, ringing in the ears, wax buildup, or bloody nose. Respiratory: Positive cough. Denies difficulty breathing or shortness of breath.  Cardiovascular: Denies chest pain, chest tightness, palpitations or swelling in the hands or feet.   No other specific complaints in a complete review of systems (except as listed in HPI above).  Objective:   BP 114/80   Pulse 77   Temp 98.1 F (36.7 C) (Oral)   Wt 175 lb (79.4 kg)   LMP 02/18/2018 Comment: irregular  SpO2 99%   BMI 32.80 kg/m  Wt Readings from Last 3 Encounters:  02/28/18 175 lb (79.4 kg)  11/09/17 171 lb 4 oz (77.7 kg)  09/21/17 173 lb (78.5 kg)     General: Appears her stated age, obese, in NAD. HEENT: Head: normal shape and size, no sinus tenderness noted; ars: Tm's gray and intact, normal light reflex; Nose: mucosa pink and moist, septum midline; Throat/Mouth: + PND. Teeth present, mucosa erythematous and moist, no exudate noted, no lesions or ulcerations noted.  Neck: No cervical lymphadenopathy.  Cardiovascular: Normal rate and rhythm. S1,S2 noted.  No murmur, rubs or gallops noted.  Pulmonary/Chest: Normal effort and positive  vesicular breath sounds. No respiratory distress. No wheezes, rales or ronchi noted.       Assessment & Plan:   Allergic Rhinitis, Cough:  Get some rest and drink plenty of water Do salt water gargles for the sore throat Start Zyrtec OTC x 10 days eRx for Tessalon 200 mg TID prn eRx for Hycodan cough syrup  RTC as needed or if symptoms persist.   Nicki Reaper, NP

## 2018-02-28 NOTE — Patient Instructions (Signed)

## 2018-03-18 ENCOUNTER — Other Ambulatory Visit: Payer: Self-pay | Admitting: Primary Care

## 2018-03-18 DIAGNOSIS — R51 Headache: Principal | ICD-10-CM

## 2018-03-18 DIAGNOSIS — R519 Headache, unspecified: Secondary | ICD-10-CM

## 2018-06-22 ENCOUNTER — Other Ambulatory Visit: Payer: Self-pay | Admitting: Primary Care

## 2018-06-22 DIAGNOSIS — G43709 Chronic migraine without aura, not intractable, without status migrainosus: Secondary | ICD-10-CM

## 2018-06-22 DIAGNOSIS — R519 Headache, unspecified: Secondary | ICD-10-CM

## 2018-06-22 DIAGNOSIS — R51 Headache: Secondary | ICD-10-CM

## 2018-06-22 MED ORDER — SUMATRIPTAN SUCCINATE 25 MG PO TABS: ORAL_TABLET | ORAL | 0 refills | Status: DC

## 2018-06-22 NOTE — Telephone Encounter (Signed)
Noted.  Refill sent pharmacy. 

## 2018-06-22 NOTE — Addendum Note (Signed)
Addended by: Doreene Nest on: 06/22/2018 06:15 PM   Modules accepted: Orders

## 2018-06-22 NOTE — Telephone Encounter (Signed)
This Rx was refused since patient was no longer taking it.   Patient called office and ask if Gail Copeland can refill this again for her migraines.

## 2019-02-06 ENCOUNTER — Other Ambulatory Visit: Payer: Self-pay | Admitting: Primary Care

## 2019-02-06 DIAGNOSIS — Z0000 Encounter for general adult medical examination without abnormal findings: Secondary | ICD-10-CM

## 2019-02-14 ENCOUNTER — Other Ambulatory Visit (INDEPENDENT_AMBULATORY_CARE_PROVIDER_SITE_OTHER): Payer: Managed Care, Other (non HMO)

## 2019-02-14 DIAGNOSIS — Z0000 Encounter for general adult medical examination without abnormal findings: Secondary | ICD-10-CM

## 2019-02-14 LAB — COMPREHENSIVE METABOLIC PANEL
ALT: 20 U/L (ref 0–35)
AST: 22 U/L (ref 0–37)
Albumin: 4.3 g/dL (ref 3.5–5.2)
Alkaline Phosphatase: 89 U/L (ref 39–117)
BUN: 10 mg/dL (ref 6–23)
CO2: 25 mEq/L (ref 19–32)
Calcium: 9.4 mg/dL (ref 8.4–10.5)
Chloride: 102 mEq/L (ref 96–112)
Creatinine, Ser: 0.55 mg/dL (ref 0.40–1.20)
GFR: 132.5 mL/min (ref >60.00)
Glucose, Bld: 84 mg/dL (ref 70–99)
Potassium: 3.9 mEq/L (ref 3.5–5.1)
Sodium: 136 mEq/L (ref 135–145)
Total Bilirubin: 0.5 mg/dL (ref 0.2–1.2)
Total Protein: 7.4 g/dL (ref 6.0–8.3)

## 2019-02-14 LAB — LIPID PANEL
Cholesterol: 182 mg/dL (ref 0–200)
HDL: 58.1 mg/dL (ref >39.00)
LDL Cholesterol: 105 mg/dL — ABNORMAL HIGH (ref 0–99)
NonHDL: 123.76
Total CHOL/HDL Ratio: 3
Triglycerides: 95 mg/dL (ref 0.0–149.0)
VLDL: 19 mg/dL (ref 0.0–40.0)

## 2019-02-14 LAB — CBC
HCT: 38.1 % (ref 36.0–46.0)
Hemoglobin: 12.6 g/dL (ref 12.0–15.0)
MCHC: 33.1 g/dL (ref 30.0–36.0)
MCV: 80.9 fl (ref 78.0–100.0)
Platelets: 330 10*3/uL (ref 150.0–400.0)
RBC: 4.72 Mil/uL (ref 3.87–5.11)
RDW: 14.5 % (ref 11.5–15.5)
WBC: 10.2 10*3/uL (ref 4.0–10.5)

## 2019-02-21 ENCOUNTER — Encounter: Payer: Self-pay | Admitting: Primary Care

## 2019-02-21 ENCOUNTER — Ambulatory Visit (INDEPENDENT_AMBULATORY_CARE_PROVIDER_SITE_OTHER): Payer: Managed Care, Other (non HMO) | Admitting: Primary Care

## 2019-02-21 ENCOUNTER — Other Ambulatory Visit: Payer: Self-pay

## 2019-02-21 ENCOUNTER — Other Ambulatory Visit (HOSPITAL_COMMUNITY)
Admission: RE | Admit: 2019-02-21 | Discharge: 2019-02-21 | Disposition: A | Discharge status: Home / Self Care | Payer: Managed Care, Other (non HMO) | Source: Ambulatory Visit | Attending: Primary Care | Admitting: Primary Care

## 2019-02-21 VITALS — BP 124/74 | HR 74 | Temp 98.0°F | Ht 61.25 in | Wt 185.8 lb

## 2019-02-21 DIAGNOSIS — R5382 Chronic fatigue, unspecified: Secondary | ICD-10-CM | POA: Insufficient documentation

## 2019-02-21 DIAGNOSIS — Z124 Encounter for screening for malignant neoplasm of cervix: Secondary | ICD-10-CM | POA: Diagnosis not present

## 2019-02-21 DIAGNOSIS — R519 Headache, unspecified: Secondary | ICD-10-CM

## 2019-02-21 DIAGNOSIS — N926 Irregular menstruation, unspecified: Secondary | ICD-10-CM

## 2019-02-21 DIAGNOSIS — L732 Hidradenitis suppurativa: Secondary | ICD-10-CM

## 2019-02-21 DIAGNOSIS — Z0000 Encounter for general adult medical examination without abnormal findings: Secondary | ICD-10-CM | POA: Diagnosis not present

## 2019-02-21 DIAGNOSIS — R5383 Other fatigue: Secondary | ICD-10-CM | POA: Diagnosis not present

## 2019-02-21 MED ORDER — CLINDAMYCIN PHOSPHATE 1 % EX SOLN: Freq: Two times a day (BID) | CUTANEOUS | 0 refills | Status: DC

## 2019-02-21 NOTE — Patient Instructions (Signed)
Stop by the lab prior to leaving today. I will notify you of your results once received.   You can apply the clindamycin solution twice daily to the armpits and groin for active symptoms.   You will be contacted regarding your referral to dermatology.  Please let us know if you have not been contacted within two weeks.   Start exercising. You should be getting 150 minutes of moderate intensity exercise weekly.  It's important to improve your diet by reducing consumption of fast food, fried food, processed snack foods, sugary drinks. Increase consumption of fresh vegetables and fruits, whole grains, water.  Ensure you are drinking 64 ounces of water daily.  It was a pleasure to see you today!   Preventive Care 58-8 Years Old, Female Preventive care refers to visits with your health care provider and lifestyle choices that can promote health and wellness. This includes:  A yearly physical exam. This may also be called an annual well check.  Regular dental visits and eye exams.  Immunizations.  Screening for certain conditions.  Healthy lifestyle choices, such as eating a healthy diet, getting regular exercise, not using drugs or products that contain nicotine and tobacco, and limiting alcohol use. What can I expect for my preventive care visit? Physical exam Your health care provider will check your:  Height and weight. This may be used to calculate body mass index (BMI), which tells if you are at a healthy weight.  Heart rate and blood pressure.  Skin for abnormal spots. Counseling Your health care provider may ask you questions about your:  Alcohol, tobacco, and drug use.  Emotional well-being.  Home and relationship well-being.  Sexual activity.  Eating habits.  Work and work Statistician.  Method of birth control.  Menstrual cycle.  Pregnancy history. What immunizations do I need?  Influenza (flu) vaccine  This is recommended every year. Tetanus,  diphtheria, and pertussis (Tdap) vaccine  You may need a Td booster every 10 years. Varicella (chickenpox) vaccine  You may need this if you have not been vaccinated. Human papillomavirus (HPV) vaccine  If recommended by your health care provider, you may need three doses over 6 months. Measles, mumps, and rubella (MMR) vaccine  You may need at least one dose of MMR. You may also need a second dose. Meningococcal conjugate (MenACWY) vaccine  One dose is recommended if you are age 10-21 years and a first-year college student living in a residence hall, or if you have one of several medical conditions. You may also need additional booster doses. Pneumococcal conjugate (PCV13) vaccine  You may need this if you have certain conditions and were not previously vaccinated. Pneumococcal polysaccharide (PPSV23) vaccine  You may need one or two doses if you smoke cigarettes or if you have certain conditions. Hepatitis A vaccine  You may need this if you have certain conditions or if you travel or work in places where you may be exposed to hepatitis A. Hepatitis B vaccine  You may need this if you have certain conditions or if you travel or work in places where you may be exposed to hepatitis B. Haemophilus influenzae type b (Hib) vaccine  You may need this if you have certain conditions. You may receive vaccines as individual doses or as more than one vaccine together in one shot (combination vaccines). Talk with your health care provider about the risks and benefits of combination vaccines. What tests do I need?  Blood tests  Lipid and cholesterol levels. These may  be checked every 5 years starting at age 57.  Hepatitis C test.  Hepatitis B test. Screening  Diabetes screening. This is done by checking your blood sugar (glucose) after you have not eaten for a while (fasting).  Sexually transmitted disease (STD) testing.  BRCA-related cancer screening. This may be done if you have  a family history of breast, ovarian, tubal, or peritoneal cancers.  Pelvic exam and Pap test. This may be done every 3 years starting at age 35. Starting at age 54, this may be done every 5 years if you have a Pap test in combination with an HPV test. Talk with your health care provider about your test results, treatment options, and if necessary, the need for more tests. Follow these instructions at home: Eating and drinking   Eat a diet that includes fresh fruits and vegetables, whole grains, lean protein, and low-fat dairy.  Take vitamin and mineral supplements as recommended by your health care provider.  Do not drink alcohol if: ? Your health care provider tells you not to drink. ? You are pregnant, may be pregnant, or are planning to become pregnant.  If you drink alcohol: ? Limit how much you have to 0-1 drink a day. ? Be aware of how much alcohol is in your drink. In the [place] one drink equals one 12 oz bottle of beer (355 mL), one 5 oz glass of wine (148 mL), or one 1 oz glass of hard liquor (44 mL). Lifestyle  Take daily care of your teeth and gums.  Stay active. Exercise for at least 30 minutes on 5 or more days each week.  Do not use any products that contain nicotine or tobacco, such as cigarettes, e-cigarettes, and chewing tobacco. If you need help quitting, ask your health care provider.  If you are sexually active, practice safe sex. Use a condom or other form of birth control (contraception) in order to prevent pregnancy and STIs (sexually transmitted infections). If you plan to become pregnant, see your health care provider for a preconception visit. What's next?  Visit your health care provider once a year for a well check visit.  Ask your health care provider how often you should have your eyes and teeth checked.  Stay up to date on all vaccines. This information is not intended to replace advice given to you by your health care provider. Make sure you discuss  any questions you have with your health care provider. Document Released: 06/09/2001 Document Revised: 12/23/2017 Document Reviewed: 12/23/2017 Elsevier Patient Education  2020 [employer].

## 2019-02-21 NOTE — Assessment & Plan Note (Signed)
Suspect secondary to lack of physical activity and weight gain. Anxiety could also be contributing.  Check labs today to rule out metabolic cause. Encouraged regular exercise, healthy diet.

## 2019-02-21 NOTE — Assessment & Plan Note (Signed)
Axilla and groin areas representative of hidradenitis suppurativa. Axilla appears to be somewhere between [facility] II and III, groin appears to be stage I.  Will start with treatment with topical clindamycin solution. Referral placed to dermatology for further evaluation as she may require further intervention.  She will update.

## 2019-02-21 NOTE — Assessment & Plan Note (Signed)
Infrequent migraines/headaches.  No recent use of the Imitrex.

## 2019-02-21 NOTE — Assessment & Plan Note (Signed)
Tetanus and influenza vaccinations UTD. Pap smear due, completed today. Discussed to work on diet, increase exercise. Exam today as noted. Labs pending.

## 2019-02-21 NOTE — Progress Notes (Signed)
Subjective:    Patient ID: Gail Copeland, female    DOB: 1990/07/23, 28 y.o.   MRN: [id number]  HPI  Ms. Gail Copeland is a 28 year old female who presents today for complete physical.  She would also like to discuss chronic intermittent boils to the axilla and groin that have been present since the age of 48. Boils are worse since gaining weight. She's never seen dermatology for this in the past. She avoids shaving.   She endorses chronic fatigue over the last year. Admits to weight gain from unhealthy diet and lack of exercise. She will sleep well during the night and wake up tired. She does have anxiety but feels as though she's overall able to manage on her own.   Immunizations: -Tetanus: Completed in 2019 -Influenza: Completed   Diet: She endorses a poor diet. She is eating a lot of rice with protein. Little vegetables or fruit. Drinking water and sweet tea. Desserts frequently.  Exercise: She is not exercising, sometimes will walk on her days off.  Eye exam: Completed in 2020 Dental exam: Completes regularly   Pap Smear: Never completed   BP Readings from Last 3 Encounters:  02/21/19 124/74  02/28/18 114/80  11/09/17 116/70     Review of Systems  Constitutional: Positive for fatigue. Negative for unexpected weight change.  HENT: Negative for rhinorrhea.   Respiratory: Negative for cough and shortness of breath.   Cardiovascular: Negative for chest pain.  Gastrointestinal: Negative for constipation and diarrhea.  Genitourinary: Negative for difficulty urinating and menstrual problem.  Musculoskeletal: Positive for arthralgias and back pain. Negative for myalgias.       Chronic lower back pain  Skin: Negative for rash.       Chronic boils under axilla and to groin since the age of 5.  Allergic/Immunologic: Negative for environmental allergies.  Neurological: Negative for dizziness, numbness and headaches.  Psychiatric/Behavioral:       Chronic anxiety, overall manages well         Past Medical History:  Diagnosis Date   Arthritis    Depression    Elevated BP without diagnosis of hypertension    Frequent headaches    GAD (generalized anxiety disorder)      Social History   Socioeconomic History   Marital status: Single    Spouse name: Not on file   Number of children: Not on file   Years of education: Not on file   Highest education level: Not on file  Occupational History   Not on file  Social Needs   Financial resource strain: Not on file   Food insecurity    Worry: Not on file    Inability: Not on file   Transportation needs    Medical: Not on file    Non-medical: Not on file  Tobacco Use   Smoking status: Never Smoker   Smokeless tobacco: Never Used  Substance and Sexual Activity   Alcohol use: No   Drug use: Not on file   Sexual activity: Not on file  Lifestyle   Physical activity    Days per week: Not on file    Minutes per session: Not on file   Stress: Not on file  Relationships   Social connections    Talks on phone: Not on file    Gets together: Not on file    Attends religious service: Not on file    Active member of club or organization: Not on file    Attends meetings  of clubs or organizations: Not on file    Relationship status: Not on file   Intimate partner violence    Fear of current or ex partner: Not on file    Emotionally abused: Not on file    Physically abused: Not on file    Forced sexual activity: Not on file  Other Topics Concern   Not on file  Social History Narrative   Single.   No children.   Works as a Scientist, research (physical sciences).   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   Enjoys spending time outdoors.     Past Surgical History:  Procedure Laterality Date   MULTIPLE TOOTH EXTRACTIONS  07/2016    Family History  Problem Relation Age of Onset   Arthritis Mother    Diabetes Paternal Grandfather    Heart attack Paternal Grandfather    Hyperlipidemia Paternal  Grandfather    Hypertension Paternal Grandfather    Kidney disease Paternal Grandfather     No Known Allergies  Current Outpatient Medications on File Prior to Visit  Medication Sig Dispense Refill   SUMAtriptan (IMITREX) 25 MG tablet Take 1 tablet by mouth at migraine onset.  May repeat in 2 hours if headache persists or recurs. 10 tablet 0   No current facility-administered medications on file prior to visit.     BP 124/74    Pulse 74    Temp 98 F (36.7 C) (Temporal)    Ht 5' 1.25" (1.556 m)    Wt 185 lb 12 oz (84.3 kg)    LMP 02/21/2019    SpO2 99%    BMI 34.81 kg/m    Objective:   Physical Exam  Constitutional: She is oriented to person, place, and time. She appears well-nourished.  HENT:  Right Ear: Tympanic membrane and ear canal normal.  Left Ear: Tympanic membrane and ear canal normal.  Mouth/Throat: Oropharynx is clear and moist.  Eyes: Pupils are equal, round, and reactive to light. EOM are normal.  Neck: Neck supple.  Cardiovascular: Normal rate and regular rhythm.  Respiratory: Effort normal and breath sounds normal.  GI: Soft. Bowel sounds are normal. There is no abdominal tenderness.  Genitourinary: There is no tenderness or lesion on the right labia. There is no tenderness or lesion on the left labia. Cervix exhibits no motion tenderness and no discharge. Right adnexum displays no tenderness. Left adnexum displays no tenderness.    Vaginal bleeding present.     No vaginal discharge.  There is bleeding in the vagina.    Genitourinary Comments: Mild bleeding, patient is on menstrual cycle.   Musculoskeletal: Normal range of motion.  Neurological: She is alert and oriented to person, place, and time.  Skin: Skin is warm and dry.  Chronic scarring to bilateral axilla with enlarged intact nodules. No inflammation, drainage erythema. Mild evidence of this to groin.  Psychiatric: She has a normal mood and affect.           Assessment & Plan:

## 2019-02-21 NOTE — Assessment & Plan Note (Signed)
Improving and is now having monthly menstruation. Suspects irregularity was from prior anxiety.

## 2019-02-22 LAB — CYTOLOGY - PAP
Comment: NEGATIVE
Diagnosis: NEGATIVE
High risk HPV: NEGATIVE

## 2019-02-23 LAB — VITAMIN D 25 HYDROXY (VIT D DEFICIENCY, FRACTURES): VITD: 13.47 ng/mL — ABNORMAL LOW (ref 30.00–100.00)

## 2019-02-23 LAB — VITAMIN B12: Vitamin B-12: 338 pg/mL (ref 211–911)

## 2019-02-23 LAB — TSH: TSH: 1.71 u[IU]/mL (ref 0.35–4.50)

## 2019-02-24 MED ORDER — VITAMIN D (ERGOCALCIFEROL) 1.25 MG (50000 UNIT) PO CAPS: 50000.0000 [IU] | ORAL_CAPSULE | ORAL | 0 refills | Status: DC

## 2019-03-17 ENCOUNTER — Encounter: Payer: Self-pay | Admitting: Primary Care

## 2019-04-17 ENCOUNTER — Other Ambulatory Visit: Payer: Self-pay | Admitting: Primary Care

## 2019-04-17 DIAGNOSIS — L732 Hidradenitis suppurativa: Secondary | ICD-10-CM

## 2019-04-17 NOTE — Telephone Encounter (Signed)
How is she doing with the clindamycin solution for her recurrent boils? Any improvement? Does she want a refill?

## 2019-04-17 NOTE — Telephone Encounter (Signed)
Last prescribed on 02/21/2019. Last appointment on 02/21/2019 (cpe) . No future appointment

## 2019-04-18 NOTE — Telephone Encounter (Signed)
Patient reply by through [facility]

## 2019-04-18 NOTE — Telephone Encounter (Signed)
Sent a message on MyChart to patient

## 2019-08-15 ENCOUNTER — Ambulatory Visit: Payer: Managed Care, Other (non HMO) | Attending: Internal Medicine

## 2019-08-15 DIAGNOSIS — Z23 Encounter for immunization: Secondary | ICD-10-CM

## 2019-08-15 NOTE — Progress Notes (Signed)
   Covid-19 Vaccination Clinic  Name:  Gail Copeland    MRN: [id number] DOB: 1991-02-25  08/15/2019  Gail Copeland was observed post Covid-19 immunization for 15 minutes without incident. She was provided with Vaccine Information Sheet and instruction to access the V-Safe system.   Gail Copeland was instructed to call 911 with any severe reactions post vaccine: Marland Kitchen Difficulty breathing  . Swelling of face and throat  . A fast heartbeat  . A bad rash all over body  . Dizziness and weakness   Immunizations Administered    Name Date Dose VIS Date Route   Moderna COVID-19 Vaccine 08/15/2019 11:20 AM 0.5 mL 03/2019 Intramuscular   Manufacturer: Moderna   Lot: [id number]   NDC: 79150-569-79

## 2019-08-16 ENCOUNTER — Ambulatory Visit: Payer: Self-pay

## 2019-11-27 IMAGING — CT CT ANGIO HEAD
3 of 11 series · 17 of 47 positions shown · IV contrast (isovue)
Comparison: None.

CLINICAL DATA: New daily persistent headache.

EXAM:
CT ANGIOGRAPHY HEAD
TECHNIQUE: Multidetector CT imaging of the head was performed using the
standard protocol during bolus administration of intravenous
contrast. Multiplanar CT image reconstructions and MIPs were
obtained to evaluate the vascular anatomy.
CONTRAST:  75 cc Isovue 370 intravenous

[Series 12: ax thin · axial · 0.34mm/px · z∈[+264,+384]mm · 11 of 146 slices shown]
[im 12/146  brain]
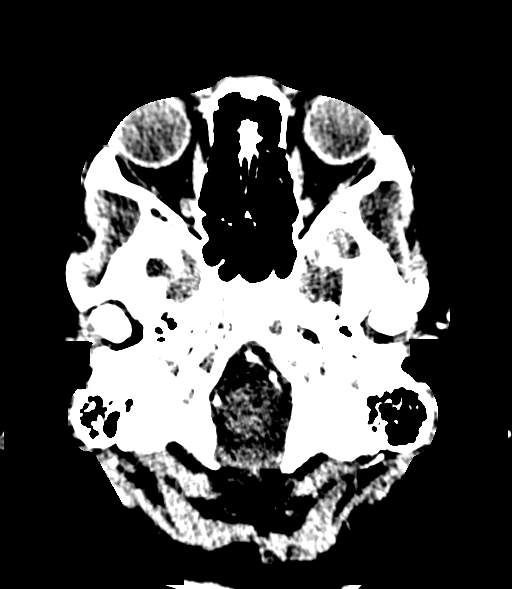
[im 23/146  bone]
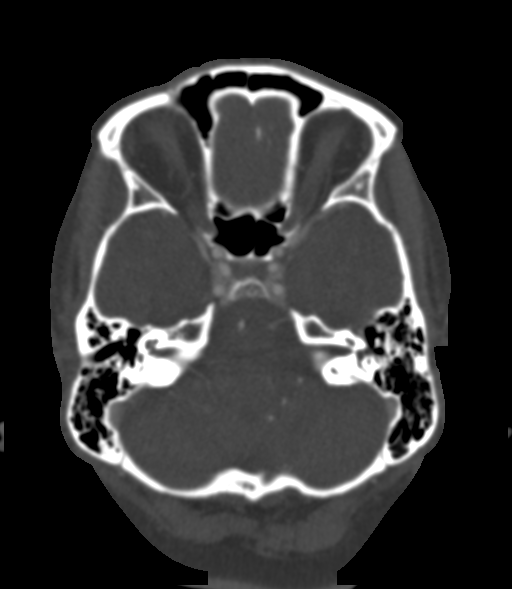
[im 34/146  brain]
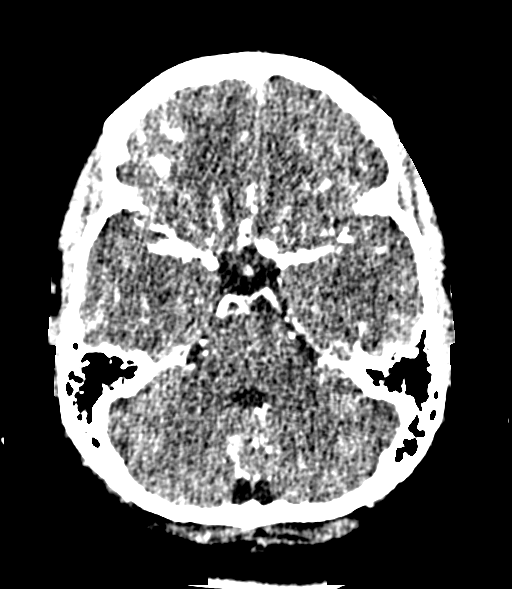
[im 45/146  bone]
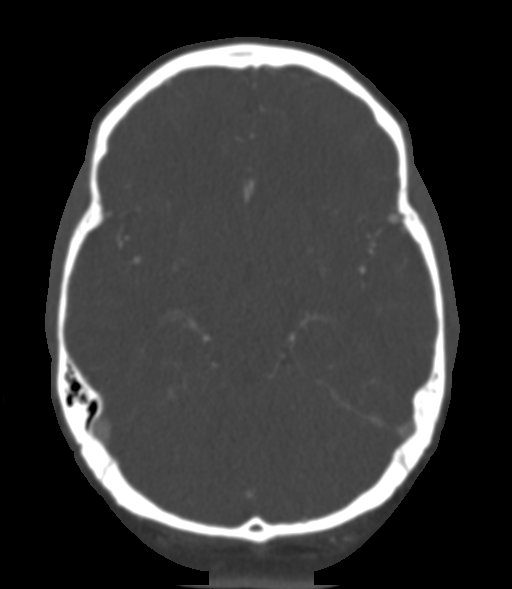
[im 56/146  brain]
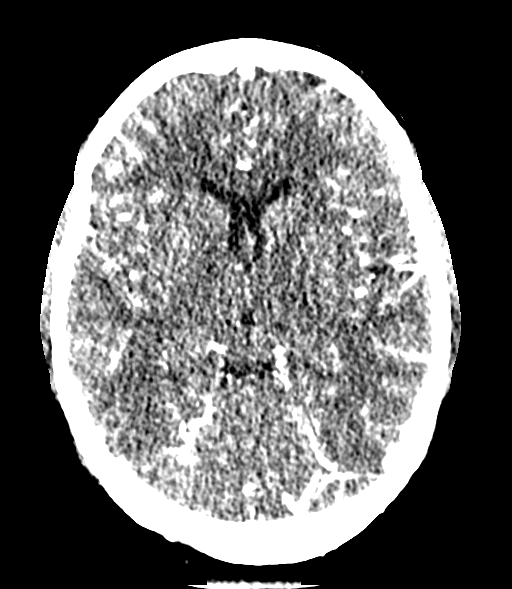
[im 79/146  bone]
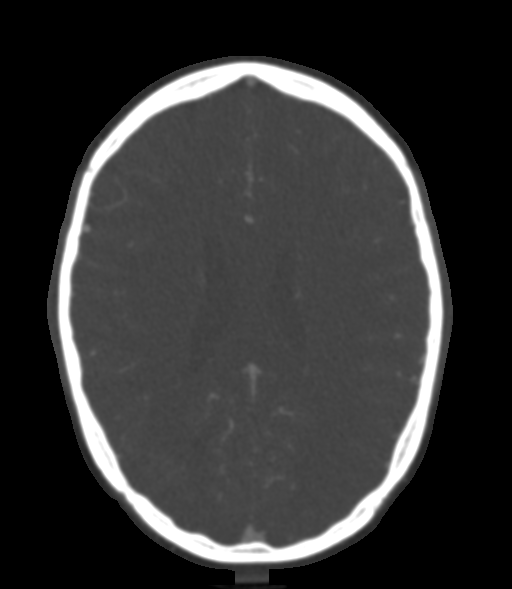
[im 90/146  brain]
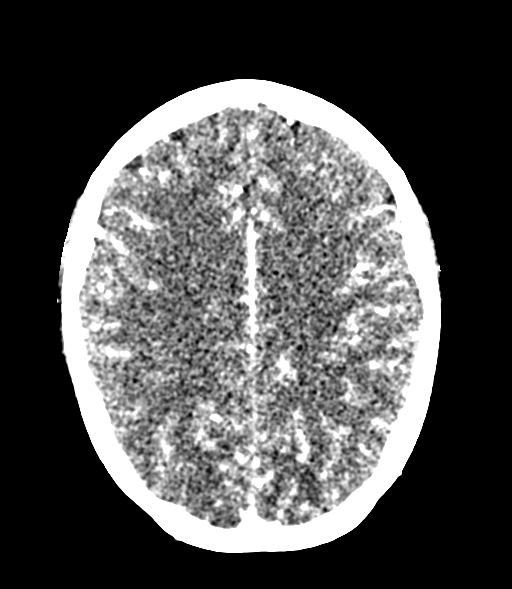
[im 101/146  bone]
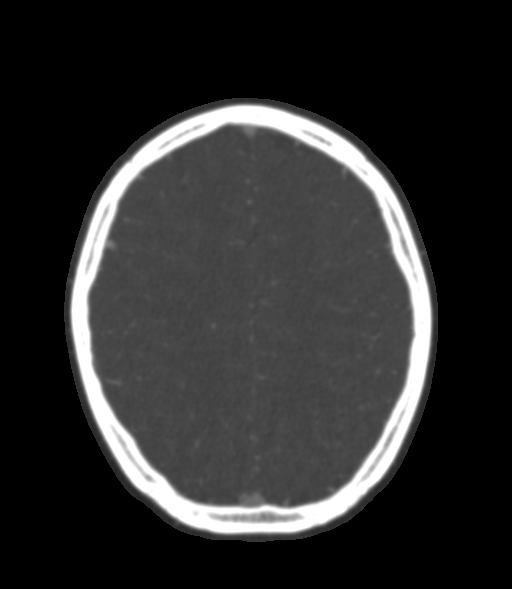
[im 112/146  brain]
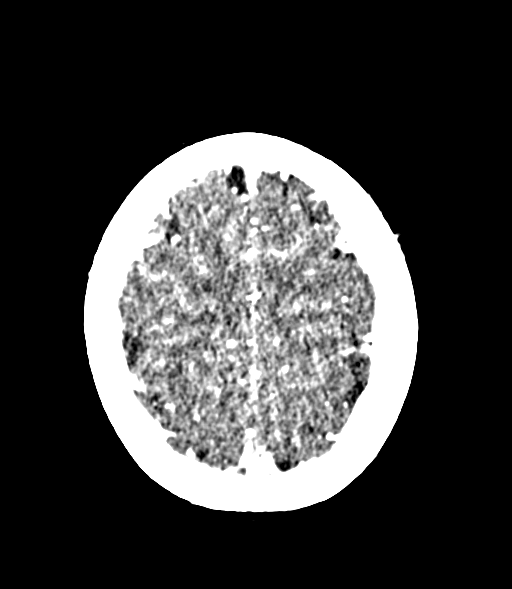
[im 123/146  bone]
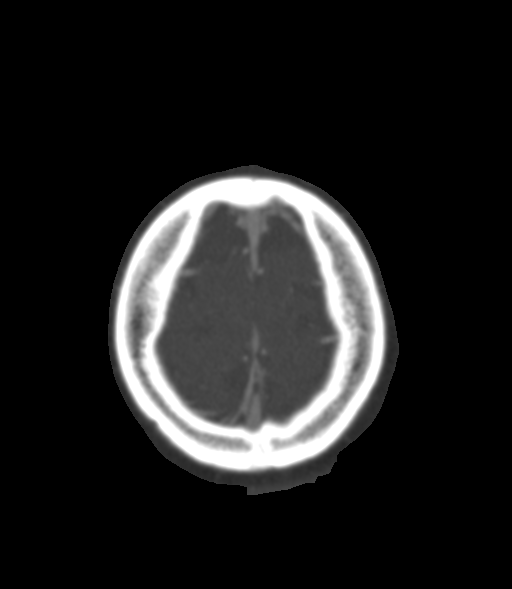
[im 134/146  brain]
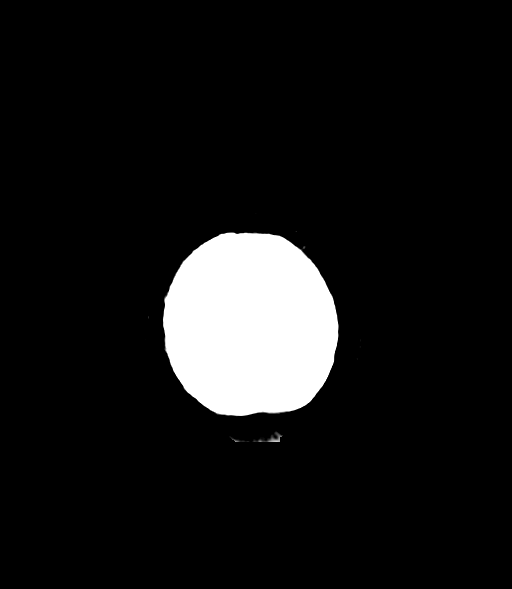

[Series 14: cor thin · coronal · 0.34mm/px · 3 of 190 slices shown]
[im 38/190  brain]
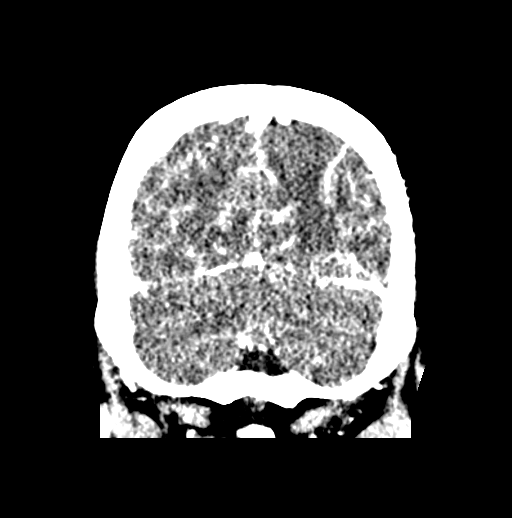
[im 76/190  brain]
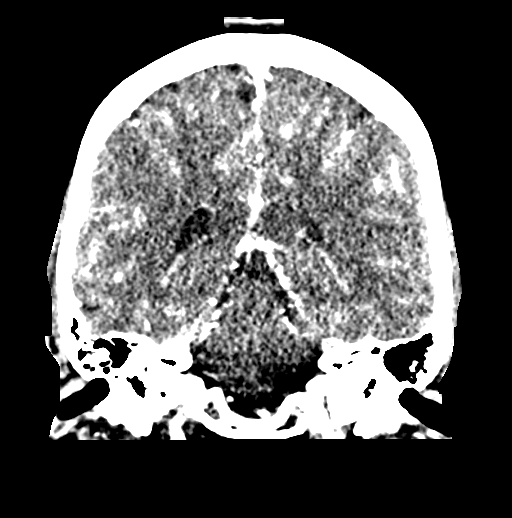
[im 114/190  brain]
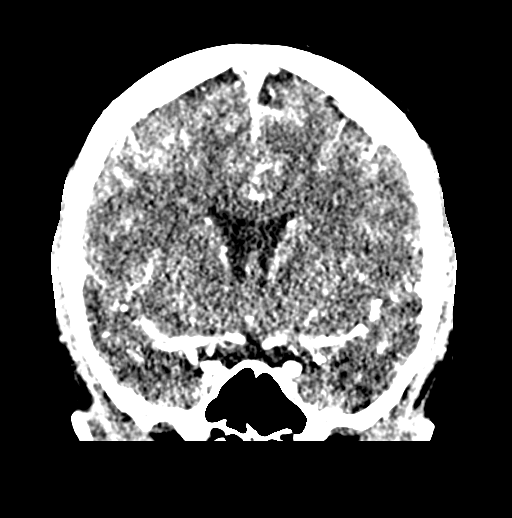

[Series 16: sag thin · sagittal · 0.31mm/px · 3 of 175 slices shown]
[im 44/175  brain]
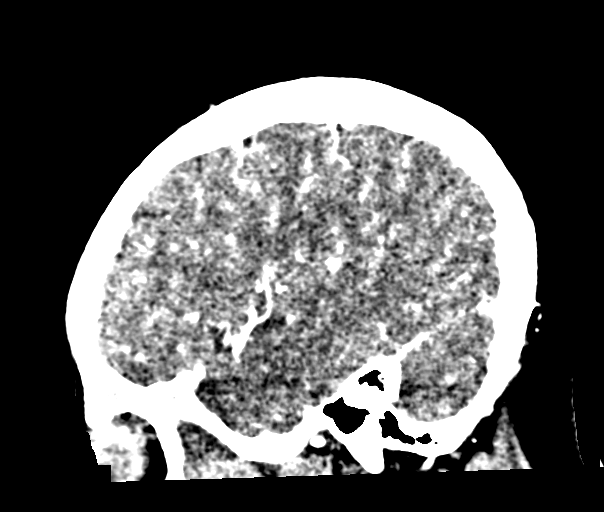
[im 88/175  brain]
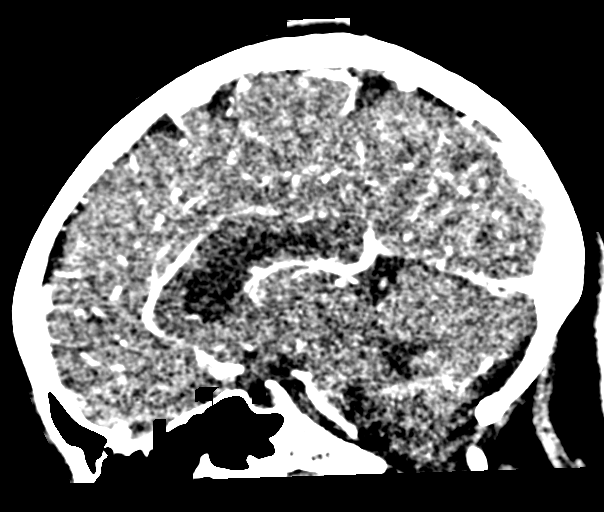
[im 131/175  brain]
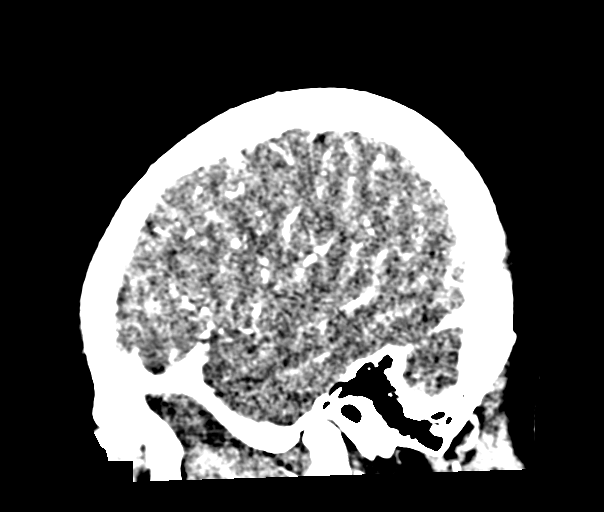

[17 of 47 positions shown; findings below may reference images not displayed]

FINDINGS: CT HEAD

Brain: No evidence of infarction, hemorrhage, hydrocephalus,
extra-axial collection or mass lesion/mass effect.

Vascular: See below

Skull: No acute or aggressive finding.

Sinuses: Negative.  No sinusitis.

Orbits: Negative

CTA HEAD

Degraded by venous contamination.

Anterior circulation: Symmetric carotid arteries and branching. No
stenosis, atherosclerosis, beading, or definite aneurysm. One mm
posteriorly directed triangular outpouching from the left mid
cavernous ICA. There is an additional posterior outpouching with the
same size and morphology from the left supraclinoid ICA. No evidence
of vascular malformation.

Posterior circulation: Small vertebral and basilar arteries in the
setting of bilateral P1 hypoplasia. Vessels are diffusely patent and
smoothly contoured. No evidence of vascular malformation or
aneurysm.

Venous sinuses: Patent

Anatomic variants: Bilateral fetal type posterior cerebral
circulation.

Delayed phase: Negative.  No abnormal intracranial enhancement
IMPRESSION: 1. No explanation for headache.
2. 1 mm outpouchings from the left supraclinoid and left mid
cavernous ICA, morphology favoring infundibula over aneurysms.

## 2019-12-21 ENCOUNTER — Ambulatory Visit: Payer: Managed Care, Other (non HMO) | Admitting: Primary Care

## 2019-12-21 ENCOUNTER — Encounter: Payer: Self-pay | Admitting: Primary Care

## 2019-12-21 ENCOUNTER — Other Ambulatory Visit: Payer: Self-pay

## 2019-12-21 VITALS — BP 122/80 | HR 78 | Temp 96.2°F | Ht 61.25 in | Wt 201.2 lb

## 2019-12-21 DIAGNOSIS — Z114 Encounter for screening for human immunodeficiency virus [HIV]: Secondary | ICD-10-CM | POA: Diagnosis not present

## 2019-12-21 DIAGNOSIS — E559 Vitamin D deficiency, unspecified: Secondary | ICD-10-CM | POA: Diagnosis not present

## 2019-12-21 DIAGNOSIS — Z1159 Encounter for screening for other viral diseases: Secondary | ICD-10-CM | POA: Diagnosis not present

## 2019-12-21 DIAGNOSIS — N939 Abnormal uterine and vaginal bleeding, unspecified: Secondary | ICD-10-CM

## 2019-12-21 LAB — BASIC METABOLIC PANEL
BUN: 11 mg/dL (ref 6–23)
CO2: 28 mEq/L (ref 19–32)
Calcium: 9.3 mg/dL (ref 8.4–10.5)
Chloride: 101 mEq/L (ref 96–112)
Creatinine, Ser: 0.64 mg/dL (ref 0.40–1.20)
GFR: 110.55 mL/min (ref >60.00)
Glucose, Bld: 98 mg/dL (ref 70–99)
Potassium: 3.9 mEq/L (ref 3.5–5.1)
Sodium: 137 mEq/L (ref 135–145)

## 2019-12-21 LAB — CBC
HCT: 37.3 % (ref 36.0–46.0)
Hemoglobin: 12.3 g/dL (ref 12.0–15.0)
MCHC: 33 g/dL (ref 30.0–36.0)
MCV: 80.8 fl (ref 78.0–100.0)
Platelets: 383 10*3/uL (ref 150.0–400.0)
RBC: 4.62 Mil/uL (ref 3.87–5.11)
RDW: 15 % (ref 11.5–15.5)
WBC: 8.8 10*3/uL (ref 4.0–10.5)

## 2019-12-21 LAB — TSH: TSH: 1.7 u[IU]/mL (ref 0.35–4.50)

## 2019-12-21 LAB — VITAMIN D 25 HYDROXY (VIT D DEFICIENCY, FRACTURES): VITD: 12.76 ng/mL — ABNORMAL LOW (ref 30.00–100.00)

## 2019-12-21 LAB — IBC + FERRITIN
Ferritin: 20.3 ng/mL (ref 10.0–291.0)
Iron: 42 ug/dL (ref 42–145)
Saturation Ratios: 9.9 % — ABNORMAL LOW (ref 20.0–50.0)
Transferrin: 303 mg/dL (ref 212.0–360.0)

## 2019-12-21 LAB — HEMOGLOBIN A1C: Hgb A1c MFr Bld: 5.8 % (ref 4.6–6.5)

## 2019-12-21 NOTE — Progress Notes (Signed)
Subjective:    Patient ID: Gail Copeland, female    DOB: 11-Mar-1991, 29 y.o.   MRN: [id number]  HPI  This visit occurred during the SARS-CoV-2 public health emergency.  Safety protocols were in place, including screening questions prior to the visit, additional usage of staff PPE, and extensive cleaning of exam room while observing appropriate contact time as indicated for disinfecting solutions.   Gail Copeland is a 29 year old female with a history of irregular menstrual cycles, anxiety and depression, frequent headaches who presents today with a chief complaint of   LMP of August 7th which ended on August 25th. Prior to that her last menstrual cycle was May 17th through 20th. She had her second Covid-10 vaccine on April 20th and believes this caused her menstrual cycles to stop temporarily.   Prior to her second Covid-19 vaccine menstrual cycles were regular lasting four days and once monthly. She's never had menses last two weeks. She also passed large blood clots. She was using four to five super pads daily which were saturated by the end of the day.  She denies increased stress, changes in diet, extreme exercise. Over the last two weeks she's felt dizzy and weak, some headaches. She's had no vaginal bleeding today. She has never had intercourse before.  Review of Systems  Constitutional: Positive for fatigue.  Respiratory: Negative for shortness of breath.   Cardiovascular: Negative for palpitations.  Gastrointestinal: Negative for abdominal pain.  Genitourinary: Positive for vaginal bleeding. Negative for dysuria.  Neurological: Positive for weakness.       Past Medical History:  Diagnosis Date  . Arthritis   . Depression   . Elevated BP without diagnosis of hypertension   . Frequent headaches   . GAD (generalized anxiety disorder)      Social History   Socioeconomic History  . Marital status: Single    Spouse name: Not on file  . Number of children: Not on file  . Years  of education: Not on file  . Highest education level: Not on file  Occupational History  . Not on file  Tobacco Use  . Smoking status: Never Smoker  . Smokeless tobacco: Never Used  Substance and Sexual Activity  . Alcohol use: No  . Drug use: Not on file  . Sexual activity: Not on file  Other Topics Concern  . Not on file  Social History Narrative   Single.   No children.   Works as a Nutritional therapist.   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   Enjoys spending time outdoors.    Social Determinants of Health   Financial Resource Strain:   . Difficulty of Paying Living Expenses: Not on file  Food Insecurity:   . Worried About Programme researcher, broadcasting/film/video in the Last Year: Not on file  . Ran Out of Food in the Last Year: Not on file  Transportation Needs:   . Lack of Transportation (Medical): Not on file  . Lack of Transportation (Non-Medical): Not on file  Physical Activity:   . Days of Exercise per Week: Not on file  . Minutes of Exercise per Session: Not on file  Stress:   . Feeling of Stress : Not on file  Social Connections:   . Frequency of Communication with Friends and Family: Not on file  . Frequency of Social Gatherings with Friends and Family: Not on file  . Attends Religious Services: Not on file  . Active Member of Clubs or  Organizations: Not on file  . Attends Banker Meetings: Not on file  . Marital Status: Not on file  Intimate Partner Violence:   . Fear of Current or Ex-Partner: Not on file  . Emotionally Abused: Not on file  . Physically Abused: Not on file  . Sexually Abused: Not on file    Past Surgical History:  Procedure Laterality Date  . MULTIPLE TOOTH EXTRACTIONS  07/2016    Family History  Problem Relation Age of Onset  . Arthritis Mother   . Diabetes Paternal Grandfather   . Heart attack Paternal Grandfather   . Hyperlipidemia Paternal Grandfather   . Hypertension Paternal Grandfather   . Kidney disease Paternal  Grandfather     No Known Allergies  Current Outpatient Medications on File Prior to Visit  Medication Sig Dispense Refill  . clindamycin (CLEOCIN T) 1 % external solution Apply topically 2 (two) times daily. 60 mL 0  . SUMAtriptan (IMITREX) 25 MG tablet Take 1 tablet by mouth at migraine onset.  May repeat in 2 hours if headache persists or recurs. 10 tablet 0   No current facility-administered medications on file prior to visit.    BP 122/80   Pulse 78   Temp (!) 96.2 F (35.7 C) (Temporal)   Ht 5' 1.25" (1.556 m)   Wt 201 lb 4 oz (91.3 kg)   LMP 12/02/2019   SpO2 98%   BMI 37.72 kg/m    Objective:   Physical Exam Cardiovascular:     Rate and Rhythm: Normal rate and regular rhythm.  Pulmonary:     Effort: Pulmonary effort is normal.     Breath sounds: Normal breath sounds.  Abdominal:     General: Abdomen is flat.  Musculoskeletal:     Cervical back: Neck supple.  Skin:    General: Skin is warm and dry.            Assessment & Plan:

## 2019-12-21 NOTE — Patient Instructions (Addendum)
Stop by the lab prior to leaving today. I will notify you of your results once received.   Please notify me if your prolonged bleeding occurs again as discussed.  It was a pleasure to see you today!   Abnormal Uterine Bleeding Abnormal uterine bleeding is unusual bleeding from the uterus. It includes:  Bleeding or spotting between periods.  Bleeding after sex.  Bleeding that is heavier than normal.  Periods that last longer than usual.  Bleeding after menopause. Abnormal uterine bleeding can affect women at various stages in life, including teenagers, women in their reproductive years, pregnant women, and women who have reached menopause. Common causes of abnormal uterine bleeding include:  Pregnancy.  Growths of tissue (polyps).  A noncancerous tumor in the uterus (fibroid).  Infection.  Cancer.  Hormonal imbalances. Any type of abnormal bleeding should be evaluated by a health care provider. Many cases are minor and simple to treat, while others are more serious. Treatment will depend on the cause of the bleeding. Follow these instructions at home:  Monitor your condition for any changes.  Do not use tampons, douche, or have sex if told by your health care provider.  Change your pads often.  Get regular exams that include pelvic exams and cervical cancer screening.  Keep all follow-up visits as told by your health care provider. This is important. Contact a health care provider if:  Your bleeding lasts for more than one week.  You feel dizzy at times.  You feel nauseous or you vomit. Get help right away if:  You pass out.  Your bleeding soaks through a pad every hour.  You have abdominal pain.  You have a fever.  You become sweaty or weak.  You pass large blood clots from your vagina. Summary  Abnormal uterine bleeding is unusual bleeding from the uterus.  Any type of abnormal bleeding should be evaluated by a health care provider. Many cases are  minor and simple to treat, while others are more serious.  Treatment will depend on the cause of the bleeding. This information is not intended to replace advice given to you by your health care provider. Make sure you discuss any questions you have with your health care provider. Document Revised: 07/21/2017 Document Reviewed: 05/15/2016 Elsevier Patient Education  2020 [employer].

## 2019-12-21 NOTE — Assessment & Plan Note (Signed)
Very odd situation. No menses after late May 2021 for which she attributes to the Covid-19 vaccine, then a return of menses for 14 days.  Prior to May she does endorse regular menses monthly.   I recommended a transvaginal and pelvic ultrasound for which she kindly declines. I strongly advised that she notify me if she has any further AUB, she agrees.  Checking labs today including CBC, iron studies, TSH, A1C, CMP. Suspect she will be anemic, may need to supplement then repeat labs.  She does appear stable for outpatient treatment.

## 2019-12-22 ENCOUNTER — Other Ambulatory Visit: Payer: Self-pay | Admitting: Primary Care

## 2019-12-22 DIAGNOSIS — E559 Vitamin D deficiency, unspecified: Secondary | ICD-10-CM

## 2019-12-22 LAB — HEPATITIS C ANTIBODY
Hepatitis C Ab: NONREACTIVE
SIGNAL TO CUT-OFF: 0.02 (ref <1.00)

## 2019-12-22 LAB — HIV ANTIBODY (ROUTINE TESTING W REFLEX): HIV 1&2 Ab, 4th Generation: NONREACTIVE

## 2019-12-22 MED ORDER — VITAMIN D (ERGOCALCIFEROL) 1.25 MG (50000 UNIT) PO CAPS: ORAL_CAPSULE | ORAL | 0 refills | Status: DC

## 2020-06-12 ENCOUNTER — Encounter: Payer: Self-pay | Admitting: Family Medicine

## 2020-06-12 ENCOUNTER — Other Ambulatory Visit: Payer: Self-pay

## 2020-06-12 ENCOUNTER — Other Ambulatory Visit: Payer: Self-pay | Admitting: Family Medicine

## 2020-06-12 ENCOUNTER — Other Ambulatory Visit (INDEPENDENT_AMBULATORY_CARE_PROVIDER_SITE_OTHER): Payer: Managed Care, Other (non HMO)

## 2020-06-12 ENCOUNTER — Telehealth (INDEPENDENT_AMBULATORY_CARE_PROVIDER_SITE_OTHER): Payer: Managed Care, Other (non HMO) | Admitting: Family Medicine

## 2020-06-12 DIAGNOSIS — R112 Nausea with vomiting, unspecified: Secondary | ICD-10-CM | POA: Diagnosis not present

## 2020-06-12 DIAGNOSIS — R197 Diarrhea, unspecified: Secondary | ICD-10-CM

## 2020-06-12 DIAGNOSIS — E559 Vitamin D deficiency, unspecified: Secondary | ICD-10-CM

## 2020-06-12 MED ORDER — PROMETHAZINE HCL 25 MG PO TABS: 25.0000 mg | ORAL_TABLET | Freq: Three times a day (TID) | ORAL | 0 refills | Status: DC | PRN

## 2020-06-12 NOTE — Patient Instructions (Signed)
I sent phenergan for nausea to your pharmacy Use with caution of sedation  Start re hydrating with sips of fluids-then more as you tolerate it  Then can try very small amounts of bland flood like crackers/toast  Then advance that very slowly as well  If you develop rapid heart rate, dizziness, dry mouth or severe abdominal pain then go to the ER  The office will call you to set up covid testing today and I will write a work note as well  Continue to isolate yourself until result returns and you feel better   Please keep [place] updated

## 2020-06-12 NOTE — Assessment & Plan Note (Signed)
Since Monday  Possible fever and no abd pain  Sent in phenergan for nausea  Counseled on re hydration and then bland diet as tolerated She has had one covid vaccine -needs the rest of the series  Ordered covid test today and inst to isolate until result and feeling better  ER parameters discussed

## 2020-06-12 NOTE — Progress Notes (Signed)
Virtual Visit via Video Note  I connected with Gail Copeland on 06/12/20 at  9:30 AM EST by a video enabled telemedicine application and verified that I am speaking with the correct person using two identifiers.  Location: Patient: home Provider: office   I discussed the limitations of evaluation and management by telemedicine and the availability of in person appointments. The patient expressed understanding and agreed to proceed.  Parties involved in encounter  Patient: Gail Copeland  Provider:  Roxy Manns MD   History of Present Illness: 30 yo pt of NP [facility] presents with n/v and headache   Symptoms for 2 days ago -Monday am  Started with and upset stomach and vomiting  (could not keep anything down)  She had diarrhea Monday- that got better  No blood in stool or black stool   Had chills Monday night- ? Possible fever   Yesterday tried to eat pb crackers , popcorn and could keep that down  Not a lot of fluids - sips   Right now - nausea is not as bad  Today has a headache   No abd pain or cramping   No rapid HR or dry mouth   No resp symptoms   No sick contacts   Otc: Nothing  Tried to take some aleve for headache- did not keep down   Has not been tested for covid   She works at [employer]   Had [employer] vaccine for covid in April- did not have subsequent     vit D level of 12.7 in august Taking high dose weekly tx   Patient Active Problem List   Diagnosis Date Noted  . Nausea vomiting and diarrhea 06/12/2020  . Abnormal uterine bleeding 12/21/2019  . Vitamin D deficiency 12/21/2019  . Fatigue 02/21/2019  . Hidradenitis suppurativa 02/21/2019  . Preventative health care 11/09/2017  . Frequent headaches 11/09/2017  . Irregular menstrual cycle 09/21/2017  . Anxiety and depression 12/22/2016   Past Medical History:  Diagnosis Date  . Arthritis   . Depression   . Elevated BP without diagnosis of hypertension   . Frequent headaches   . GAD  (generalized anxiety disorder)    Past Surgical History:  Procedure Laterality Date  . MULTIPLE TOOTH EXTRACTIONS  07/2016   Social History   Tobacco Use  . Smoking status: Never Smoker  . Smokeless tobacco: Never Used  Substance Use Topics  . Alcohol use: No   Family History  Problem Relation Age of Onset  . Arthritis Mother   . Diabetes Paternal Grandfather   . Heart attack Paternal Grandfather   . Hyperlipidemia Paternal Grandfather   . Hypertension Paternal Grandfather   . Kidney disease Paternal Grandfather    No Known Allergies Current Outpatient Medications on File Prior to Visit  Medication Sig Dispense Refill  . SUMAtriptan (IMITREX) 25 MG tablet Take 1 tablet by mouth at migraine onset.  May repeat in 2 hours if headache persists or recurs. (Patient not taking: Reported on 06/12/2020) 10 tablet 0  . Vitamin D, Ergocalciferol, (DRISDOL) 1.25 MG (50000 UNIT) CAPS capsule Take 1 capsule by mouth once weekly for 12 weeks. (Patient not taking: Reported on 06/12/2020) 12 capsule 0   No current facility-administered medications on file prior to visit.   Review of Systems  Constitutional: Positive for chills and malaise/fatigue. Negative for fever.  HENT: Negative for congestion, ear pain, sinus pain and sore throat.   Eyes: Negative for blurred vision, discharge and redness.  Respiratory: Negative  for cough, shortness of breath and stridor.   Cardiovascular: Negative for chest pain, palpitations and leg swelling.  Gastrointestinal: Positive for diarrhea, nausea and vomiting. Negative for abdominal pain, blood in stool and melena.  Musculoskeletal: Negative for myalgias.  Skin: Negative for rash.  Neurological: Positive for headaches. Negative for dizziness.    Observations/Objective: Patient appears well, in no distress Weight is baseline  No facial swelling or asymmetry Normal voice-not hoarse and no slurred speech No obvious tremor or mobility impairment Moving neck  and UEs normally Able to hear the call well  No cough or shortness of breath during interview  Talkative and mentally sharp with no cognitive changes No skin changes on face or neck , no rash or pallor Affect is normal    Assessment and Plan: Problem List Items Addressed This Visit      Digestive   Nausea vomiting and diarrhea    Since Monday  Possible fever and no abd pain  Sent in phenergan for nausea  Counseled on re hydration and then bland diet as tolerated She has had one covid vaccine -needs the rest of the series  Ordered covid test today and inst to isolate until result and feeling better  ER parameters discussed        Other   Vitamin D deficiency - Primary       Follow Up Instructions: I sent phenergan for nausea to your pharmacy Use with caution of sedation  Start re hydrating with sips of fluids-then more as you tolerate it  Then can try very small amounts of bland flood like crackers/toast  Then advance that very slowly as well  If you develop rapid heart rate, dizziness, dry mouth or severe abdominal pain then go to the ER  The office will call you to set up covid testing today and I will write a work note as well  Continue to isolate yourself until result returns and you feel better   Please keep [place] updated    I discussed the assessment and treatment plan with the patient. The patient was provided an opportunity to ask questions and all were answered. The patient agreed with the plan and demonstrated an understanding of the instructions.   The patient was advised to call back or seek an in-person evaluation if the symptoms worsen or if the condition fails to improve as anticipated.     Roxy Manns, MD

## 2020-06-13 LAB — NOVEL CORONAVIRUS, NAA: SARS-CoV-2, NAA: NOT DETECTED

## 2020-06-13 LAB — SARS-COV-2, NAA 2 DAY TAT

## 2020-11-08 ENCOUNTER — Encounter: Payer: Managed Care, Other (non HMO) | Admitting: Primary Care

## 2021-02-12 ENCOUNTER — Other Ambulatory Visit: Payer: Self-pay

## 2021-02-12 ENCOUNTER — Encounter: Payer: Self-pay | Admitting: Primary Care

## 2021-02-12 ENCOUNTER — Ambulatory Visit (INDEPENDENT_AMBULATORY_CARE_PROVIDER_SITE_OTHER): Payer: Managed Care, Other (non HMO) | Admitting: Primary Care

## 2021-02-12 VITALS — BP 124/72 | HR 82 | Temp 97.8°F | Ht 61.25 in | Wt 189.0 lb

## 2021-02-12 DIAGNOSIS — R7303 Prediabetes: Secondary | ICD-10-CM

## 2021-02-12 DIAGNOSIS — F419 Anxiety disorder, unspecified: Secondary | ICD-10-CM

## 2021-02-12 DIAGNOSIS — G43709 Chronic migraine without aura, not intractable, without status migrainosus: Secondary | ICD-10-CM

## 2021-02-12 DIAGNOSIS — F32A Depression, unspecified: Secondary | ICD-10-CM

## 2021-02-12 DIAGNOSIS — Z0000 Encounter for general adult medical examination without abnormal findings: Secondary | ICD-10-CM

## 2021-02-12 DIAGNOSIS — E559 Vitamin D deficiency, unspecified: Secondary | ICD-10-CM

## 2021-02-12 DIAGNOSIS — R519 Headache, unspecified: Secondary | ICD-10-CM

## 2021-02-12 LAB — LIPID PANEL
Cholesterol: 177 mg/dL (ref 0–200)
HDL: 58.3 mg/dL (ref >39.00)
LDL Cholesterol: 96 mg/dL (ref 0–99)
NonHDL: 119.06
Total CHOL/HDL Ratio: 3
Triglycerides: 115 mg/dL (ref 0.0–149.0)
VLDL: 23 mg/dL (ref 0.0–40.0)

## 2021-02-12 LAB — CBC
HCT: 40.4 % (ref 36.0–46.0)
Hemoglobin: 13.2 g/dL (ref 12.0–15.0)
MCHC: 32.6 g/dL (ref 30.0–36.0)
MCV: 78.8 fl (ref 78.0–100.0)
Platelets: 372 10*3/uL (ref 150.0–400.0)
RBC: 5.12 Mil/uL — ABNORMAL HIGH (ref 3.87–5.11)
RDW: 16.3 % — ABNORMAL HIGH (ref 11.5–15.5)
WBC: 7.8 10*3/uL (ref 4.0–10.5)

## 2021-02-12 LAB — COMPREHENSIVE METABOLIC PANEL
ALT: 15 U/L (ref 0–35)
AST: 21 U/L (ref 0–37)
Albumin: 4.7 g/dL (ref 3.5–5.2)
Alkaline Phosphatase: 98 U/L (ref 39–117)
BUN: 11 mg/dL (ref 6–23)
CO2: 28 mEq/L (ref 19–32)
Calcium: 10.2 mg/dL (ref 8.4–10.5)
Chloride: 101 mEq/L (ref 96–112)
Creatinine, Ser: 0.58 mg/dL (ref 0.40–1.20)
GFR: 122.59 mL/min (ref >60.00)
Glucose, Bld: 87 mg/dL (ref 70–99)
Potassium: 4.1 mEq/L (ref 3.5–5.1)
Sodium: 137 mEq/L (ref 135–145)
Total Bilirubin: 0.4 mg/dL (ref 0.2–1.2)
Total Protein: 8.5 g/dL — ABNORMAL HIGH (ref 6.0–8.3)

## 2021-02-12 LAB — HEMOGLOBIN A1C: Hgb A1c MFr Bld: 5.7 % (ref 4.6–6.5)

## 2021-02-12 LAB — VITAMIN D 25 HYDROXY (VIT D DEFICIENCY, FRACTURES): VITD: 11.21 ng/mL — ABNORMAL LOW (ref 30.00–100.00)

## 2021-02-12 MED ORDER — SUMATRIPTAN SUCCINATE 25 MG PO TABS: ORAL_TABLET | ORAL | 0 refills | Status: AC

## 2021-02-12 MED ORDER — SERTRALINE HCL 50 MG PO TABS: 50.0000 mg | ORAL_TABLET | Freq: Every day | ORAL | 0 refills | Status: DC

## 2021-02-12 NOTE — Assessment & Plan Note (Signed)
Not currently taking vitamin D. Repeat level pending.

## 2021-02-12 NOTE — Patient Instructions (Signed)
Stop by the lab prior to leaving today. I will notify you of your results once received.   Start sertraline (Zoloft) 50 mg for anxiety and depression. Take 1/2 tablet by mouth once daily for about one week, then increase to 1 full tablet thereafter.   Please contact me if you experience any problems when starting this medication or with the dose increase to 1 full tablet. Common side effects should abate within 1-2 weeks.   You may take sumatriptan (Imitrex) as needed for migraines. Take 1 tablet at migraine onset, repeat with an additional dose 2 hours later if migraine persists.  Please update me via MyChart in 6 weeks regarding stress and anxiety.  It was a pleasure to see you today!  Preventive Care 1-64 Years Old, Female Preventive care refers to lifestyle choices and visits with your health care provider that can promote health and wellness. This includes: A yearly physical exam. This is also called an annual wellness visit. Regular dental and eye exams. Immunizations. Screening for certain conditions. Healthy lifestyle choices, such as: Eating a healthy diet. Getting regular exercise. Not using drugs or products that contain nicotine and tobacco. Limiting alcohol use. What can I expect for my preventive care visit? Physical exam Your health care provider may check your: Height and weight. These may be used to calculate your BMI (body mass index). BMI is a measurement that tells if you are at a healthy weight. Heart rate and blood pressure. Body temperature. Skin for abnormal spots. Counseling Your health care provider may ask you questions about your: Past medical problems. Family's medical history. Alcohol, tobacco, and drug use. Emotional well-being. Home life and relationship well-being. Sexual activity. Diet, exercise, and sleep habits. Work and work Statistician. Access to firearms. Method of birth control. Menstrual cycle. Pregnancy history. What immunizations  do I need? Vaccines are usually given at various ages, according to a schedule. Your health care provider will recommend vaccines for you based on your age, medical history, and lifestyle or other factors, such as travel or where you work. What tests do I need? Blood tests Lipid and cholesterol levels. These may be checked every 5 years starting at age 43. Hepatitis C test. Hepatitis B test. Screening Diabetes screening. This is done by checking your blood sugar (glucose) after you have not eaten for a while (fasting). STD (sexually transmitted disease) testing, if you are at risk. BRCA-related cancer screening. This may be done if you have a family history of breast, ovarian, tubal, or peritoneal cancers. Pelvic exam and Pap test. This may be done every 3 years starting at age 65. Starting at age 5, this may be done every 5 years if you have a Pap test in combination with an HPV test. Talk with your health care provider about your test results, treatment options, and if necessary, the need for more tests. Follow these instructions at home: Eating and drinking  Eat a healthy diet that includes fresh fruits and vegetables, whole grains, lean protein, and low-fat dairy products. Take vitamin and mineral supplements as recommended by your health care provider. Do not drink alcohol if: Your health care provider tells you not to drink. You are pregnant, may be pregnant, or are planning to become pregnant. If you drink alcohol: Limit how much you have to 0-1 drink a day. Be aware of how much alcohol is in your drink. In the [place] one drink equals one 12 oz bottle of beer (355 mL), one 5 oz glass of wine (  148 mL), or one 1 oz glass of hard liquor (44 mL). Lifestyle Take daily care of your teeth and gums. Brush your teeth every morning and night with fluoride toothpaste. Floss one time each day. Stay active. Exercise for at least 30 minutes 5 or more days each week. Do not use any products that  contain nicotine or tobacco, such as cigarettes, e-cigarettes, and chewing tobacco. If you need help quitting, ask your health care provider. Do not use drugs. If you are sexually active, practice safe sex. Use a condom or other form of protection to prevent STIs (sexually transmitted infections). If you do not wish to become pregnant, use a form of birth control. If you plan to become pregnant, see your health care provider for a prepregnancy visit. Find healthy ways to cope with stress, such as: Meditation, yoga, or listening to music. Journaling. Talking to a trusted person. Spending time with friends and family. Safety Always wear your seat belt while driving or riding in a vehicle. Do not drive: If you have been drinking alcohol. Do not ride with someone who has been drinking. When you are tired or distracted. While texting. Wear a helmet and other protective equipment during sports activities. If you have firearms in your house, make sure you follow all gun safety procedures. Seek help if you have been physically or sexually abused. What's next? Go to your health care provider once a year for an annual wellness visit. Ask your health care provider how often you should have your eyes and teeth checked. Stay up to date on all vaccines. This information is not intended to replace advice given to you by your health care provider. Make sure you discuss any questions you have with your health care provider. Document Revised: 06/21/2020 Document Reviewed: 12/23/2017 Elsevier Patient Education  2022 [employer].

## 2021-02-12 NOTE — Assessment & Plan Note (Signed)
Will get flu shot at work. Tetanus UTD. Pap smear UTD, due 2023.  Discussed the importance of a healthy diet and regular exercise in order for weight loss, and to reduce the risk of further co-morbidity.  Exam today stable. Labs pending.

## 2021-02-12 NOTE — Progress Notes (Signed)
Subjective:    Patient ID: Gail Copeland, female    DOB: 1991-03-05, 30 y.o.   MRN: [id number]  HPI  Gail Copeland is a very pleasant 30 y.o. female who presents today for complete physical and follow up of chronic conditions.  She would also like to discuss chronic anxiety and depression. Chronic history, was on sertraline in the past, never really took consistently. Symptoms include difficulty sleeping, worrying, irritability, doesn't want to be around others.  Immunizations: -Tetanus: 2019 -Influenza: Will get at work.  -Covid-19: 2 vaccines   Diet: Fair diet.  Exercise: No regular exercise, active at work.   Eye exam: Completes annually  Dental exam: Completes semi-annually   Pap Smear: Completed in 2020  BP Readings from Last 3 Encounters:  02/12/21 124/72  12/21/19 122/80  02/21/19 124/74   Wt Readings from Last 3 Encounters:  02/12/21 189 lb (85.7 kg)  12/21/19 201 lb 4 oz (91.3 kg)  02/21/19 185 lb 12 oz (84.3 kg)       Review of Systems  Constitutional:  Negative for unexpected weight change.  HENT:  Negative for rhinorrhea.   Eyes:  Negative for visual disturbance.  Respiratory:  Negative for cough and shortness of breath.   Cardiovascular:  Negative for chest pain.  Gastrointestinal:  Negative for constipation and diarrhea.  Genitourinary:  Negative for difficulty urinating and menstrual problem.  Musculoskeletal:  Negative for arthralgias and myalgias.  Skin:  Negative for rash.  Allergic/Immunologic: Negative for environmental allergies.  Neurological:  Positive for light-headedness and headaches. Negative for numbness.  Psychiatric/Behavioral:  Positive for sleep disturbance. Negative for suicidal ideas. The patient is nervous/anxious.         Past Medical History:  Diagnosis Date   Arthritis    Depression    Elevated BP without diagnosis of hypertension    Frequent headaches    GAD (generalized anxiety disorder)     Social History    Socioeconomic History   Marital status: Single    Spouse name: Not on file   Number of children: Not on file   Years of education: Not on file   Highest education level: Not on file  Occupational History   Not on file  Tobacco Use   Smoking status: Never   Smokeless tobacco: Never  Substance and Sexual Activity   Alcohol use: No   Drug use: Not on file   Sexual activity: Not on file  Other Topics Concern   Not on file  Social History Narrative   Single.   No children.   Works as a Nutritional therapist.   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   Enjoys spending time outdoors.    Social Determinants of Health   Financial Resource Strain: Not on file  Food Insecurity: Not on file  Transportation Needs: Not on file  Physical Activity: Not on file  Stress: Not on file  Social Connections: Not on file  Intimate Partner Violence: Not on file    Past Surgical History:  Procedure Laterality Date   MULTIPLE TOOTH EXTRACTIONS  07/2016    Family History  Problem Relation Age of Onset   Arthritis Mother    Diabetes Paternal Grandfather    Heart attack Paternal Grandfather    Hyperlipidemia Paternal Grandfather    Hypertension Paternal Grandfather    Kidney disease Paternal Grandfather     No Known Allergies  No current outpatient medications on file prior to visit.   No current facility-administered medications on  file prior to visit.    BP 124/72   Pulse 82   Temp 97.8 F (36.6 C) (Temporal)   Ht 5' 1.25" (1.556 m)   Wt 189 lb (85.7 kg)   LMP 01/31/2021   SpO2 97%   BMI 35.42 kg/m  Objective:   Physical Exam HENT:     Right Ear: Tympanic membrane and ear canal normal.     Left Ear: Tympanic membrane and ear canal normal.     Nose: Nose normal.  Eyes:     Conjunctiva/sclera: Conjunctivae normal.     Pupils: Pupils are equal, round, and reactive to light.  Neck:     Thyroid: No thyromegaly.  Cardiovascular:     Rate and Rhythm: Normal rate  and regular rhythm.     Heart sounds: No murmur heard. Pulmonary:     Effort: Pulmonary effort is normal.     Breath sounds: Normal breath sounds. No rales.  Abdominal:     General: Bowel sounds are normal.     Palpations: Abdomen is soft.     Tenderness: There is no abdominal tenderness.  Musculoskeletal:        General: Normal range of motion.     Cervical back: Neck supple.  Lymphadenopathy:     Cervical: No cervical adenopathy.  Skin:    General: Skin is warm and dry.     Findings: No rash.  Neurological:     Mental Status: She is alert and oriented to person, place, and time.     Cranial Nerves: No cranial nerve deficit.     Deep Tendon Reflexes: Reflexes are normal and symmetric.  Psychiatric:        Mood and Affect: Mood normal.          Assessment & Plan:      This visit occurred during the SARS-CoV-2 public health emergency.  Safety protocols were in place, including screening questions prior to the visit, additional usage of staff PPE, and extensive cleaning of exam room while observing appropriate contact time as indicated for disinfecting solutions.

## 2021-02-12 NOTE — Assessment & Plan Note (Addendum)
Overall infequent, recent migraines due to stress.   Will refill sumatriptan 25 mg, has done well historically.  Refills provided.   Will also be treating anxiety and depression.

## 2021-02-12 NOTE — Assessment & Plan Note (Signed)
A1C of 5.8 last year.  Discussed the importance of a healthy diet and regular exercise in order for weight loss, and to reduce the risk of further co-morbidity.  Repeat A1C pending.

## 2021-02-12 NOTE — Assessment & Plan Note (Signed)
Deteriorated and active now.  She would like to resume treatment with sertraline. Will start with 25 mg for 1-2 weeks, then increase to 50 mg.  She kindly declines therapy.  She will update in about 6 weeks via MyChart.

## 2021-02-13 DIAGNOSIS — R7303 Prediabetes: Secondary | ICD-10-CM

## 2021-02-13 DIAGNOSIS — E559 Vitamin D deficiency, unspecified: Secondary | ICD-10-CM

## 2021-02-14 MED ORDER — VITAMIN D (ERGOCALCIFEROL) 1.25 MG (50000 UNIT) PO CAPS: ORAL_CAPSULE | ORAL | 0 refills | Status: DC

## 2021-03-12 ENCOUNTER — Other Ambulatory Visit: Payer: Self-pay | Admitting: Primary Care

## 2021-03-12 DIAGNOSIS — G43709 Chronic migraine without aura, not intractable, without status migrainosus: Secondary | ICD-10-CM

## 2021-05-11 ENCOUNTER — Other Ambulatory Visit: Payer: Self-pay | Admitting: Primary Care

## 2021-05-11 DIAGNOSIS — F419 Anxiety disorder, unspecified: Secondary | ICD-10-CM

## 2021-05-11 DIAGNOSIS — F32A Depression, unspecified: Secondary | ICD-10-CM

## 2021-05-23 ENCOUNTER — Other Ambulatory Visit: Payer: Self-pay | Admitting: Primary Care

## 2021-05-23 DIAGNOSIS — E559 Vitamin D deficiency, unspecified: Secondary | ICD-10-CM

## 2021-06-05 ENCOUNTER — Other Ambulatory Visit: Payer: Self-pay

## 2021-06-05 ENCOUNTER — Other Ambulatory Visit: Payer: Self-pay | Admitting: Primary Care

## 2021-06-05 ENCOUNTER — Other Ambulatory Visit (INDEPENDENT_AMBULATORY_CARE_PROVIDER_SITE_OTHER): Payer: BC Managed Care – PPO

## 2021-06-05 DIAGNOSIS — E559 Vitamin D deficiency, unspecified: Secondary | ICD-10-CM

## 2021-06-05 DIAGNOSIS — R7303 Prediabetes: Secondary | ICD-10-CM | POA: Diagnosis not present

## 2021-06-05 LAB — HEMOGLOBIN A1C: Hgb A1c MFr Bld: 5.7 % (ref 4.6–6.5)

## 2021-06-05 LAB — VITAMIN D 25 HYDROXY (VIT D DEFICIENCY, FRACTURES): VITD: 11.71 ng/mL — ABNORMAL LOW (ref 30.00–100.00)

## 2021-06-05 MED ORDER — VITAMIN D3 1.25 MG (50000 UT) PO CAPS: ORAL_CAPSULE | ORAL | 0 refills | Status: DC

## 2021-10-21 ENCOUNTER — Ambulatory Visit (INDEPENDENT_AMBULATORY_CARE_PROVIDER_SITE_OTHER): Payer: BC Managed Care – PPO | Admitting: Primary Care

## 2021-10-21 ENCOUNTER — Encounter: Payer: Self-pay | Admitting: Primary Care

## 2021-10-21 VITALS — BP 124/62 | HR 106 | Temp 98.6°F | Ht 61.25 in | Wt 202.0 lb

## 2021-10-21 DIAGNOSIS — S0990XA Unspecified injury of head, initial encounter: Secondary | ICD-10-CM

## 2021-10-21 DIAGNOSIS — R42 Dizziness and giddiness: Secondary | ICD-10-CM

## 2021-10-21 DIAGNOSIS — E559 Vitamin D deficiency, unspecified: Secondary | ICD-10-CM

## 2021-10-21 DIAGNOSIS — R0609 Other forms of dyspnea: Secondary | ICD-10-CM | POA: Diagnosis not present

## 2021-10-21 NOTE — Progress Notes (Signed)
Subjective:    Patient ID: Gail Copeland, female    DOB: Jan 10, 1991, 31 y.o.   MRN: [id number]  Dizziness Pertinent negatives include no congestion, coughing, fever or headaches.  Shortness of Breath Pertinent negatives include no ear pain, fever, headaches, rhinorrhea or wheezing.  Fall Pertinent negatives include no fever or headaches.    Gail Copeland is a very pleasant 31 y.o. female with a history of abnormal uterine bleeding, irregular menses, anxiety/depression, prediabetes, frequent headaches who presents today to discuss multiple concerns.  1) Dizziness: Acute for the last 4-6 weeks. Her dizziness occurs intermittently when hoping off of a higher chair. She will also notice throbbing to the left occipital lobe, nausea, and pressure to the anterior face. She also hears the throbbing sensation. Symptoms will last less than a minute, improved with walking. Mostly this occurs while at work, or when rising seated position, rising from bed in the morning, bending down lower to reach an object with her head tilted downward. She has no symptoms with exertion or walking.   She denies headaches, visual changes, increased stress, heavy menstrual bleeding, post nasal drip, nasal congestion. She doesn't feel as though the room is spinning. She drinks 2 bottles of water daily for the most part, overall drinks few liquids in general.   She slipped in the shower on 08/19/21, hit her head twice to the occipital region. The same day she noticed "liquid coming from my nose". She did not seek medical treatment. This is the first she has mentioned these symptoms and the incident.   2) Shortness of Breath: Evaluated at [facility] ED on 04/09/22 for a one month history of progressing SOB and cough. Work up in the ED with elevated BNP of 300, tachycardia 122. She underwent a bedside ultrasound which showed mildly decreased global LV function - otherwise unremarkable. Troponin and ECG negative; chest xray negative. 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 She was instructed to start furosemide 20 mg daily as needed and to complete an echocardiogram. She underwent echocardiogram on 05/16/21 which revealed LVEF of 60-65%, normal wall motion and thickness.   Today she endorses to continue to experience shortness of breath. She experiences shortness of breath at rest but mostly with mild to moderate exertion. She denies cough, lower extremity swelling, a childhood history of asthma, second hand smoke exposure. She has never smoked.   Review of Systems  Constitutional:  Negative for fever.  HENT:  Negative for congestion, ear pain, postnasal drip and rhinorrhea.   Respiratory:  Positive for shortness of breath. Negative for cough and wheezing.   Allergic/Immunologic: Negative for environmental allergies.  Neurological:  Positive for dizziness. Negative for headaches.  Psychiatric/Behavioral:  The patient is not nervous/anxious.          Past Medical History:  Diagnosis Date   Arthritis    Depression    Elevated BP without diagnosis of hypertension    Frequent headaches    GAD (generalized anxiety disorder)     Social History   Socioeconomic History   Marital status: Single    Spouse name: Not on file   Number of children: Not on file   Years of education: Not on file   Highest education level: Not on file  Occupational History   Not on file  Tobacco Use   Smoking status: Never   Smokeless tobacco: Never  Substance  and Sexual Activity   Alcohol use: No   Drug use: Not on file   Sexual activity: Not on file  Other Topics Concern   Not on file  Social History Narrative   Single.   No children.   Works as a Nutritional therapist.   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   Enjoys spending time outdoors.    Social Determinants of Health   Financial  Resource Strain: Not on file  Food Insecurity: Not on file  Transportation Needs: Not on file  Physical Activity: Not on file  Stress: Not on file  Social Connections: Not on file  Intimate Partner Violence: Not on file    Past Surgical History:  Procedure Laterality Date   MULTIPLE TOOTH EXTRACTIONS  07/2016    Family History  Problem Relation Age of Onset   Arthritis Mother    Diabetes Paternal Grandfather    Heart attack Paternal Grandfather    Hyperlipidemia Paternal Grandfather    Hypertension Paternal Grandfather    Kidney disease Paternal Grandfather     No Known Allergies  Current Outpatient Medications on File Prior to Visit  Medication Sig Dispense Refill   Cholecalciferol (VITAMIN D3) 1.25 MG (50000 UT) CAPS Take 1 capsule by mouth once weekly for low vitamin D. 12 capsule 0   SUMAtriptan (IMITREX) 25 MG tablet Take 1 tablet by mouth at migraine onset.  May repeat in 2 hours if headache persists or recurs. 10 tablet 0   No current facility-administered medications on file prior to visit.    BP 124/62   Pulse (!) 106   Temp 98.6 F (37 C) (Oral)   Ht 5' 1.25" (1.556 m)   Wt 202 lb (91.6 kg)   SpO2 96%   BMI 37.86 kg/m  Objective:   Physical Exam HENT:     Right Ear: Tympanic membrane and ear canal normal.     Left Ear: Tympanic membrane and ear canal normal.  Eyes:     Extraocular Movements: Extraocular movements intact.  Cardiovascular:     Rate and Rhythm: Normal rate and regular rhythm.  Pulmonary:     Effort: Pulmonary effort is normal.     Breath sounds: Normal breath sounds.  Musculoskeletal:     Cervical back: Neck supple.  Skin:    General: Skin is warm and dry.  Neurological:     Mental Status: She is oriented to person, place, and time.     Cranial Nerves: No cranial nerve deficit.     Motor: No weakness.     Gait: Gait normal.           Assessment & Plan:   Problem List Items Addressed This Visit       Other   Vitamin D  deficiency    No longer on vitamin D supplementation. Repeat vitamin D level pending.      Relevant Orders   VITAMIN D 25 Hydroxy (Vit-D Deficiency, Fractures)   Exertional dyspnea - Primary    Unclear etiology. Reviewed ED notes from [facility] in December 2022, reviewed cardiology notes from [facility] in December 2022, reviewed echocardiogram from January 2023.  Referral placed to pulmonology for further evaluation. Checking labs today.      Relevant Orders   Ambulatory referral to Pulmonology   Brain natriuretic peptide   Basic metabolic panel   CBC   Dizziness    MRI brain ordered and pending. Labs ordered and pending.  Exam today unremarkable.  Negative  orthostatic hypotension.  We did discuss the absolute need to increase hydration with liquids, especially water.        Relevant Orders   MR Brain Wo Contrast   Head trauma    Obtaining MRI brain, especially in the setting of prior CTA findings, dizziness, and 2 head trauma.        Relevant Orders   MR Brain Wo Contrast       Doreene Nest, NP

## 2021-10-21 NOTE — Assessment & Plan Note (Signed)
Unclear etiology. Reviewed ED notes from [facility] in December 2022, reviewed cardiology notes from [facility] in December 2022, reviewed echocardiogram from January 2023.  Referral placed to pulmonology for further evaluation. Checking labs today.

## 2021-10-21 NOTE — Assessment & Plan Note (Signed)
No longer on vitamin D supplementation. Repeat vitamin D level pending.

## 2021-10-22 ENCOUNTER — Other Ambulatory Visit: Payer: Self-pay | Admitting: Primary Care

## 2021-10-22 DIAGNOSIS — R0609 Other forms of dyspnea: Secondary | ICD-10-CM

## 2021-10-22 DIAGNOSIS — E559 Vitamin D deficiency, unspecified: Secondary | ICD-10-CM

## 2021-10-22 LAB — CBC
HCT: 37.3 % (ref 36.0–46.0)
Hemoglobin: 12.4 g/dL (ref 12.0–15.0)
MCHC: 33.3 g/dL (ref 30.0–36.0)
MCV: 80.8 fl (ref 78.0–100.0)
Platelets: 344 10*3/uL (ref 150.0–400.0)
RBC: 4.62 Mil/uL (ref 3.87–5.11)
RDW: 14.7 % (ref 11.5–15.5)
WBC: 10 10*3/uL (ref 4.0–10.5)

## 2021-10-22 LAB — BASIC METABOLIC PANEL
BUN: 9 mg/dL (ref 6–23)
CO2: 25 mEq/L (ref 19–32)
Calcium: 9.3 mg/dL (ref 8.4–10.5)
Chloride: 102 mEq/L (ref 96–112)
Creatinine, Ser: 0.59 mg/dL (ref 0.40–1.20)
GFR: 121.49 mL/min (ref >60.00)
Glucose, Bld: 84 mg/dL (ref 70–99)
Potassium: 3.6 mEq/L (ref 3.5–5.1)
Sodium: 138 mEq/L (ref 135–145)

## 2021-10-22 LAB — VITAMIN D 25 HYDROXY (VIT D DEFICIENCY, FRACTURES): VITD: 13.88 ng/mL — ABNORMAL LOW (ref 30.00–100.00)

## 2021-10-22 MED ORDER — VITAMIN D3 1.25 MG (50000 UT) PO CAPS: ORAL_CAPSULE | ORAL | 0 refills | Status: AC

## 2021-12-15 ENCOUNTER — Encounter: Payer: Self-pay | Admitting: *Deleted

## 2022-04-28 ENCOUNTER — Ambulatory Visit: Payer: BC Managed Care – PPO | Admitting: Primary Care

## 2022-04-28 ENCOUNTER — Encounter: Payer: Self-pay | Admitting: Primary Care

## 2022-04-28 VITALS — BP 122/84 | HR 82 | Temp 97.9°F | Ht 61.25 in | Wt 190.0 lb

## 2022-04-28 DIAGNOSIS — N6311 Unspecified lump in the right breast, upper outer quadrant: Secondary | ICD-10-CM | POA: Diagnosis not present

## 2022-04-28 DIAGNOSIS — Z803 Family history of malignant neoplasm of breast: Secondary | ICD-10-CM | POA: Diagnosis not present

## 2022-04-28 NOTE — Assessment & Plan Note (Signed)
Orders placed for diagnostic mammogram and right breast ultrasound.  Consider genetic testing.

## 2022-04-28 NOTE — Assessment & Plan Note (Signed)
Unclear etiology, but given her family history of breast cancer, coupled with findings on exam, will place diagnostic mammogram with right breast ultrasound. She prefers [facility], orders placed.  Await results.

## 2022-04-28 NOTE — Progress Notes (Signed)
Subjective:    Patient ID: Gail Copeland, female    DOB: 28-Jan-1991, 32 y.o.   MRN: [id number]  HPI  Gail Copeland is a very pleasant 32 y.o. female with a history of hidradenitis suppurativa, abnormal uterine bleeding, irregular menstrual cycles, prediabetes who presents today to discuss breast mass.  The mass is present to the right breast at the 1-2 o'clock position for which she noticed about 1-2 weeks ago. She denies tenderness, changes in skin texture, nipple discharge.   Her mother has a history of breast cancer, diagnosed at the age of 75. Her mother has not undergone genetic testing. She has not undergone genetic testing. She's unsure if there is any other family history of cancers as [place].   Her menstrual cycle is due this week.    Review of Systems  Genitourinary:        Right breast mass  Skin:  Negative for color change.         Past Medical History:  Diagnosis Date   Arthritis    Depression    Elevated BP without diagnosis of hypertension    Frequent headaches    GAD (generalized anxiety disorder)     Social History   Socioeconomic History   Marital status: Single    Spouse name: Not on file   Number of children: Not on file   Years of education: Not on file   Highest education level: Not on file  Occupational History   Not on file  Tobacco Use   Smoking status: Never   Smokeless tobacco: Never  Substance and Sexual Activity   Alcohol use: No   Drug use: Not on file   Sexual activity: Not on file  Other Topics Concern   Not on file  Social History Narrative   Single.   No children.   Works as a Scientist, research (physical sciences).   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   Enjoys spending time outdoors.    Social Determinants of Health   Financial Resource Strain: Not on file  Food Insecurity: Not on file  Transportation Needs: Not on file  Physical Activity: Not on file  Stress: Not on file  Social Connections:  Not on file  Intimate Partner Violence: Not on file    Past Surgical History:  Procedure Laterality Date   MULTIPLE TOOTH EXTRACTIONS  07/2016    Family History  Problem Relation Age of Onset   Arthritis Mother    Diabetes Paternal Grandfather    Heart attack Paternal Grandfather    Hyperlipidemia Paternal Grandfather    Hypertension Paternal Grandfather    Kidney disease Paternal Grandfather     No Known Allergies  Current Outpatient Medications on File Prior to Visit  Medication Sig Dispense Refill   Cholecalciferol (VITAMIN D3) 1.25 MG (50000 UT) CAPS Take 1 capsule by mouth once weekly for low vitamin D. 12 capsule 0   SUMAtriptan (IMITREX) 25 MG tablet Take 1 tablet by mouth at migraine onset.  May repeat in 2 hours if headache persists or recurs. 10 tablet 0   No current facility-administered medications on file prior to visit.    BP 122/84   Pulse 82   Temp 97.9 F (36.6 C) (Temporal)   Ht 5' 1.25" (1.556 m)   Wt 190 lb (86.2 kg)   SpO2 99%   BMI 35.61 kg/m  Objective:   Physical Exam Chest:  Breasts:    Right: Mass  present. No nipple discharge, skin change or tenderness.     Left: Normal. No mass, nipple discharge, skin change or tenderness.       Comments: 0.25 cm rounded, firm, immobile mass to right breast at 1-2 o'clock position. Non tender.  Skin:    General: Skin is warm and dry.     Findings: No erythema.           Assessment & Plan:   Problem List Items Addressed This Visit       Other   Mass of upper outer quadrant of right breast - Primary    Unclear etiology, but given her family history of breast cancer, coupled with findings on exam, will place diagnostic mammogram with right breast ultrasound. She prefers [facility] ([place] orders placed.  Await results.       Relevant Orders   US BREAST LTD UNI RIGHT INC AXILLA   MM DIAG BREAST TOMO BILATERAL   Family history of breast cancer in mother    Orders placed for diagnostic  mammogram and right breast ultrasound.  Consider genetic testing.           Pleas Koch, NP

## 2022-04-28 NOTE — Patient Instructions (Signed)
You will either be contacted via phone regarding your mammogram and ultrasound, or you may receive a letter on your MyChart portal from our referral team with instructions for scheduling an appointment. Please let us know if you have not been contacted by anyone within two weeks.  It was a pleasure to see you today!

## 2022-04-30 ENCOUNTER — Encounter: Payer: Self-pay | Admitting: *Deleted

## 2022-08-13 ENCOUNTER — Ambulatory Visit: Payer: BC Managed Care – PPO | Admitting: Primary Care

## 2022-08-13 VITALS — BP 136/90 | HR 73 | Temp 97.9°F | Ht 61.25 in | Wt 194.5 lb

## 2022-08-13 DIAGNOSIS — R42 Dizziness and giddiness: Secondary | ICD-10-CM

## 2022-08-13 DIAGNOSIS — S0990XS Unspecified injury of head, sequela: Secondary | ICD-10-CM

## 2022-08-13 DIAGNOSIS — R030 Elevated blood-pressure reading, without diagnosis of hypertension: Secondary | ICD-10-CM

## 2022-08-13 DIAGNOSIS — R0609 Other forms of dyspnea: Secondary | ICD-10-CM

## 2022-08-13 DIAGNOSIS — R7303 Prediabetes: Secondary | ICD-10-CM | POA: Diagnosis not present

## 2022-08-13 DIAGNOSIS — R519 Headache, unspecified: Secondary | ICD-10-CM

## 2022-08-13 DIAGNOSIS — E559 Vitamin D deficiency, unspecified: Secondary | ICD-10-CM

## 2022-08-13 DIAGNOSIS — R5382 Chronic fatigue, unspecified: Secondary | ICD-10-CM

## 2022-08-13 NOTE — Assessment & Plan Note (Signed)
Repeat A1c pending.  Discussed that cortisol level should be drawn in the morning, she understands that would like to proceed now. Insulin regimen ordered and pending.

## 2022-08-13 NOTE — Assessment & Plan Note (Signed)
Labs pending today. Consider undiagnosed/untreated hypertension, lower suspicion for sleep apnea but still on differential list.

## 2022-08-13 NOTE — Patient Instructions (Addendum)
Stop by the lab prior to leaving today. I will notify you of your results once received.   Start monitoring your blood pressure daily, around the same time of day, for the next 2-3 weeks.  Ensure that you have rested for 30 minutes prior to checking your blood pressure.   Record your readings and send them to me via MyChart.  You will be contacted via phone regarding your MRI. Please let us know if you have not been contacted by anyone within two weeks.  It was a pleasure to see you today!

## 2022-08-13 NOTE — Assessment & Plan Note (Signed)
Not on vitamin D supplements. Repeat vitamin D level.

## 2022-08-13 NOTE — Assessment & Plan Note (Signed)
Never completed MRI brain, looks like a system error, patient never followed through.  Re-order MRI brain today. She will update if she is not contacted within one week.  No alarm signs on exam

## 2022-08-13 NOTE — Assessment & Plan Note (Signed)
Chronic and continued.  Unfortunately, MRI brain was not completed. Re-ordered.   I've asked that she update if she does not hear from scheduling within 1 week.

## 2022-08-13 NOTE — Assessment & Plan Note (Signed)
Above goal today, about the same on recheck.  Since this is our first elevated documented blood pressure reading, I have asked that she start monitoring her blood pressure at home and report readings back in 2 weeks via MyChart.  Checking labs today.

## 2022-08-13 NOTE — Progress Notes (Signed)
Subjective:    Patient ID: Gail Copeland, female    DOB: 08-Oct-1990, 32 y.o.   MRN: [id number]  HPI  Gail Copeland is a very pleasant 32 y.o. female with a history of irregular menstrual cycles, prediabetes, vitamin D deficiency, anxiety and depression who presents today to discuss elevated blood pressure, prediabetes, dizziness. She is also requesting some lab work.  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 On occasion her BP was "normal".   She does not check her BP at home or at work. She was placed on Adderall XR 5 mg BID per psychiatry for a new diagnosis of ADHD. While taking Adderall she noticed a rise in her BP which was 160's/90's, so she stopped Adderall.   She will notice headaches twice weekly, she's also noticed feeling lightheaded and dizziness. She isn't sure if her mother has hypertension but her father does not.    BP Readings from Last 3 Encounters:  08/13/22 (!) 136/90  04/28/22 122/84  10/21/21 124/62   2) Vitamin D Deficiency: Chronic history. Last vitamin D level was 14.2 in November 2023. She is no taking vitamin D.   3) Prediabetes: A1C of 6.1 in November 2023 and 5.7 in February 2023. She's not been working on her diet. She has noticed darkening skin around her neck, polyuria, polydipsia.  She would like labs drawn today including CRP, cortisol, random insulin.   Wt Readings from Last 3 Encounters:  08/13/22 194 lb 8 oz (88.2 kg)  04/28/22 190 lb (86.2 kg)  10/21/21 202 lb (91.6 kg)     4) Dizziness: Chronic since Spring 2023 since a traumatic fall hitting her head. Occurs intermittently, multiple times daily, when changing positions, hopping off of a high chair, rising from a kneeling position, crouching. She will also experience pressure behind her nose and to the left occipital lobe. Also with visual disturbance,  evaluated by optometry in December 2023, normal exam. Evaluated for the same symptoms in June 2023, MRI was ordered but this was never completed.    Review of Systems  Constitutional:  Positive for fatigue.  Endocrine: Positive for polydipsia and polyuria.  Neurological:  Positive for dizziness and headaches.         Past Medical History:  Diagnosis Date   Arthritis    Depression    Elevated BP without diagnosis of hypertension    Frequent headaches    GAD (generalized anxiety disorder)     Social History   Socioeconomic History   Marital status: Single    Spouse name: Not on file   Number of children: Not on file   Years of education: Not on file   Highest education level: Bachelor's degree (e.g., BA, AB, BS)  Occupational History   Not on file  Tobacco Use   Smoking status: Never   Smokeless tobacco: Never  Substance and Sexual Activity   Alcohol use: No   Drug use: Not on file   Sexual activity: Not on file  Other Topics Concern   Not on file  Social History Narrative   Single.   No children.   Works as a Nutritional therapist.   Currently a Consulting civil engineer at [employer].   Enjoys spending time outdoors.    Social Determinants of Health   Financial Resource Strain: Low Risk  (08/13/2022)   Overall Financial Resource Strain (CARDIA)  Difficulty of Paying Living Expenses: Not very hard  Food Insecurity: No Food Insecurity (08/13/2022)   Hunger Vital Sign    Worried About Running Out of Food in the Last Year: Never true    Ran Out of Food in the Last Year: Never true  Transportation Needs: No Transportation Needs (08/13/2022)   PRAPARE - Administrator, Civil Service (Medical): No    Lack of Transportation (Non-Medical): No  Physical Activity: Insufficiently Active (08/13/2022)   Exercise Vital Sign    Days of Exercise per Week: 1 day    Minutes of Exercise per Session: 40 min  Stress: Stress Concern Present (08/13/2022)   [employer] of  Occupational Health - Occupational Stress Questionnaire    Feeling of Stress : To some extent  Social Connections: Moderately Isolated (08/13/2022)   Social Connection and Isolation Panel [NHANES]    Frequency of Communication with Friends and Family: More than three times a week    Frequency of Social Gatherings with Friends and Family: Twice a week    Attends Religious Services: More than 4 times per year    Active Member of [employer] or Organizations: No    Attends Engineer, structural: Not on file    Marital Status: Never married  Intimate Partner Violence: Not on file    Past Surgical History:  Procedure Laterality Date   MULTIPLE TOOTH EXTRACTIONS  07/2016    Family History  Problem Relation Age of Onset   Arthritis Mother    Diabetes Paternal Grandfather    Heart attack Paternal Grandfather    Hyperlipidemia Paternal Grandfather    Hypertension Paternal Grandfather    Kidney disease Paternal Grandfather     No Known Allergies  Current Outpatient Medications on File Prior to Visit  Medication Sig Dispense Refill   Cholecalciferol (VITAMIN D3) 1.25 MG (50000 UT) CAPS Take 1 capsule by mouth once weekly for low vitamin D. 12 capsule 0   SUMAtriptan (IMITREX) 25 MG tablet Take 1 tablet by mouth at migraine onset.  May repeat in 2 hours if headache persists or recurs. 10 tablet 0   amphetamine-dextroamphetamine (ADDERALL XR) 5 MG 24 hr capsule Take 5 mg by mouth daily. (Patient not taking: Reported on 08/13/2022)     escitalopram (LEXAPRO) 10 MG tablet Take 10 mg by mouth daily. (Patient not taking: Reported on 08/13/2022)     No current facility-administered medications on file prior to visit.    BP (!) 136/90 (BP Location: Left Arm, Patient Position: Sitting, Cuff Size: Large)   Pulse 73   Temp 97.9 F (36.6 C) (Temporal)   Ht 5' 1.25" (1.556 m)   Wt 194 lb 8 oz (88.2 kg)   LMP 08/09/2022   SpO2 97%   BMI 36.45 kg/m  Objective:   Physical Exam Cardiovascular:      Rate and Rhythm: Normal rate and regular rhythm.  Pulmonary:     Effort: Pulmonary effort is normal.     Breath sounds: Normal breath sounds.  Musculoskeletal:     Cervical back: Neck supple.  Skin:    General: Skin is warm and dry.  Psychiatric:        Mood and Affect: Mood normal.           Assessment & Plan:  Dizziness Assessment & Plan: Chronic and continued.  Unfortunately, MRI brain was not completed. Re-ordered.   I've asked that she update if she does not hear from scheduling within 1 week.  Orders: -  MR BRAIN WO CONTRAST; Future  Vitamin D deficiency Assessment & Plan: Not on vitamin D supplements. Repeat vitamin D level.  Orders: -     VITAMIN D 25 Hydroxy (Vit-D Deficiency, Fractures)  Exertional dyspnea -     Brain natriuretic peptide  Prediabetes Assessment & Plan: Repeat A1c pending.  Discussed that cortisol level should be drawn in the morning, she understands that would like to proceed now. Insulin regimen ordered and pending.  Orders: -     Hemoglobin A1c -     C-reactive protein -     Cortisol -     Insulin, random  Frequent headaches -     MR BRAIN WO CONTRAST; Future  Traumatic injury of head, sequela Assessment & Plan: Never completed MRI brain, looks like a system error, patient never followed through.  Re-order MRI brain today. She will update if she is not contacted within one week.  No alarm signs on exam   Chronic fatigue Assessment & Plan: Labs pending today. Consider undiagnosed/untreated hypertension, lower suspicion for sleep apnea but still on differential list.  Orders: -     TSH -     T4, free -     CBC -     IBC + Ferritin  Elevated blood pressure reading without diagnosis of hypertension Assessment & Plan: Above goal today, about the same on recheck.  Since this is our first elevated documented blood pressure reading, I have asked that she start monitoring her blood pressure at home and  report readings back in 2 weeks via MyChart.  Checking labs today.         Doreene Nest, NP

## 2022-08-14 ENCOUNTER — Encounter: Payer: Self-pay | Admitting: *Deleted

## 2022-08-14 LAB — CBC
HCT: 37.2 % (ref 36.0–46.0)
Hemoglobin: 12.4 g/dL (ref 12.0–15.0)
MCHC: 33.4 g/dL (ref 30.0–36.0)
MCV: 81.3 fl (ref 78.0–100.0)
Platelets: 380 10*3/uL (ref 150.0–400.0)
RBC: 4.57 Mil/uL (ref 3.87–5.11)
RDW: 14.4 % (ref 11.5–15.5)
WBC: 10 10*3/uL (ref 4.0–10.5)

## 2022-08-14 LAB — VITAMIN D 25 HYDROXY (VIT D DEFICIENCY, FRACTURES): VITD: 23.33 ng/mL — ABNORMAL LOW (ref 30.00–100.00)

## 2022-08-14 LAB — HEMOGLOBIN A1C: Hgb A1c MFr Bld: 5.7 % (ref 4.6–6.5)

## 2022-08-14 LAB — TSH: TSH: 2.2 u[IU]/mL (ref 0.35–5.50)

## 2022-08-14 LAB — IBC + FERRITIN
Ferritin: 16.9 ng/mL (ref 10.0–291.0)
Iron: 44 ug/dL (ref 42–145)
Saturation Ratios: 11 % — ABNORMAL LOW (ref 20.0–50.0)
TIBC: 399 ug/dL (ref 250.0–450.0)
Transferrin: 285 mg/dL (ref 212.0–360.0)

## 2022-08-14 LAB — T4, FREE: Free T4: 0.96 ng/dL (ref 0.60–1.60)

## 2022-08-14 LAB — C-REACTIVE PROTEIN: CRP: 1.1 mg/dL (ref 0.5–20.0)

## 2022-08-14 LAB — INSULIN, RANDOM: Insulin: 12.7 u[IU]/mL

## 2022-08-14 LAB — CORTISOL: Cortisol, Plasma: 2.6 ug/dL

## 2022-08-14 LAB — BRAIN NATRIURETIC PEPTIDE: Pro B Natriuretic peptide (BNP): 16 pg/mL (ref 0.0–100.0)

## 2022-08-20 ENCOUNTER — Ambulatory Visit: Payer: BC Managed Care – PPO | Admitting: Primary Care
